# Patient Record
Sex: Male | Born: 1980 | Race: White | Hispanic: No | Marital: Married | Smoking: Never smoker
Health system: Southern US, Community
[De-identification: ages and names within clinical notes are randomized; demographics above are authoritative.]

## PROBLEM LIST (undated history)

## (undated) DIAGNOSIS — T7840XA Allergy, unspecified, initial encounter: Secondary | ICD-10-CM

## (undated) DIAGNOSIS — K602 Anal fissure, unspecified: Secondary | ICD-10-CM

## (undated) DIAGNOSIS — G5600 Carpal tunnel syndrome, unspecified upper limb: Secondary | ICD-10-CM

## (undated) DIAGNOSIS — L439 Lichen planus, unspecified: Secondary | ICD-10-CM

## (undated) DIAGNOSIS — N486 Induration penis plastica: Secondary | ICD-10-CM

## (undated) DIAGNOSIS — K589 Irritable bowel syndrome without diarrhea: Secondary | ICD-10-CM

## (undated) HISTORY — DX: Carpal tunnel syndrome, unspecified upper limb: G56.00

## (undated) HISTORY — DX: Lichen planus, unspecified: L43.9

## (undated) HISTORY — DX: Anal fissure, unspecified: K60.2

## (undated) HISTORY — DX: Induration penis plastica: N48.6

## (undated) HISTORY — DX: Irritable bowel syndrome, unspecified: K58.9

## (undated) HISTORY — DX: Allergy, unspecified, initial encounter: T78.40XA

## (undated) HISTORY — PX: CIRCUMCISION: SUR203

## (undated) HISTORY — PX: CARPAL TUNNEL RELEASE: SHX101

---

## 2013-01-01 DIAGNOSIS — G5600 Carpal tunnel syndrome, unspecified upper limb: Secondary | ICD-10-CM | POA: Insufficient documentation

## 2014-06-15 ENCOUNTER — Inpatient Hospital Stay: Payer: Self-pay | Admitting: Internal Medicine

## 2014-06-15 LAB — COMPREHENSIVE METABOLIC PANEL
ALBUMIN: 4.1 g/dL (ref 3.4–5.0)
ALT: 84 U/L — AB
Alkaline Phosphatase: 110 U/L
Anion Gap: 8 (ref 7–16)
BUN: 20 mg/dL — ABNORMAL HIGH (ref 7–18)
Bilirubin,Total: 1 mg/dL (ref 0.2–1.0)
CHLORIDE: 104 mmol/L (ref 98–107)
CREATININE: 1.14 mg/dL (ref 0.60–1.30)
Calcium, Total: 8.8 mg/dL (ref 8.5–10.1)
Co2: 24 mmol/L (ref 21–32)
EGFR (African American): >60
Glucose: 108 mg/dL — ABNORMAL HIGH (ref 65–99)
OSMOLALITY: 275 (ref 275–301)
Potassium: 3.8 mmol/L (ref 3.5–5.1)
SGOT(AST): 57 U/L — ABNORMAL HIGH (ref 15–37)
SODIUM: 136 mmol/L (ref 136–145)
Total Protein: 7.4 g/dL (ref 6.4–8.2)

## 2014-06-15 LAB — CBC
HCT: 46.3 % (ref 40.0–52.0)
HGB: 15.7 g/dL (ref 13.0–18.0)
MCH: 28.1 pg (ref 26.0–34.0)
MCHC: 33.9 g/dL (ref 32.0–36.0)
MCV: 83 fL (ref 80–100)
Platelet: 186 10*3/uL (ref 150–440)
RBC: 5.57 10*6/uL (ref 4.40–5.90)
RDW: 13.3 % (ref 11.5–14.5)
WBC: 10.5 10*3/uL (ref 3.8–10.6)

## 2014-06-16 LAB — CBC WITH DIFFERENTIAL/PLATELET
BASOS ABS: 0.1 10*3/uL (ref 0.0–0.1)
Basophil %: 0.9 %
Eosinophil #: 0.2 10*3/uL (ref 0.0–0.7)
Eosinophil %: 1.3 %
HCT: 42.3 % (ref 40.0–52.0)
HGB: 14 g/dL (ref 13.0–18.0)
LYMPHS PCT: 0.9 %
Lymphocyte #: 0.1 10*3/uL — ABNORMAL LOW (ref 1.0–3.6)
MCH: 28.2 pg (ref 26.0–34.0)
MCHC: 33.2 g/dL (ref 32.0–36.0)
MCV: 85 fL (ref 80–100)
MONO ABS: 0.8 x10 3/mm (ref 0.2–1.0)
Monocyte %: 4.9 %
Neutrophil #: 15.2 10*3/uL — ABNORMAL HIGH (ref 1.4–6.5)
Neutrophil %: 92 %
PLATELETS: 172 10*3/uL (ref 150–440)
RBC: 4.98 10*6/uL (ref 4.40–5.90)
RDW: 13.5 % (ref 11.5–14.5)
WBC: 16.5 10*3/uL — ABNORMAL HIGH (ref 3.8–10.6)

## 2014-06-20 LAB — CULTURE, BLOOD (SINGLE)

## 2014-07-04 DIAGNOSIS — K4090 Unilateral inguinal hernia, without obstruction or gangrene, not specified as recurrent: Secondary | ICD-10-CM | POA: Insufficient documentation

## 2014-07-04 DIAGNOSIS — N50819 Testicular pain, unspecified: Secondary | ICD-10-CM | POA: Insufficient documentation

## 2014-07-04 DIAGNOSIS — N4340 Spermatocele of epididymis, unspecified: Secondary | ICD-10-CM | POA: Insufficient documentation

## 2014-11-18 NOTE — H&P (Signed)
PATIENT NAME:  Charles Gonzales, Charles Gonzales MR#:  [id number] DATE OF BIRTH:  Jul 26, 1981  DATE OF ADMISSION:  06/15/2014  PRIMARY CARE PHYSICIAN: None.  CHIEF COMPLAINT: Fever and pain in the buttocks region.   HISTORY OF PRESENT ILLNESS: This is a very pleasant 34 year old male with no past medical history who presents to the ER with weakness and pain in his buttocks. In the ER, it was that he might have a cellulitis and was going to be discharged on p.o. Bactrim, but apparently he had tachycardia, heart rates in the 150s, and a fever, so he was admitted for sepsis.   REVIEW OF SYSTEMS: CONSTITUTIONAL: Positive fever, fatigue, weakness. EYES: No blurred or double vision, glaucoma or cataracts. ENT: No ear pain, hearing loss, snoring, postnasal drip. RESPIRATORY: No cough, wheezing, hemoptysis, COPD. CARDIOVASCULAR: No chest pain, orthopnea, edema, arrhythmia, syncope. GASTROINTESTINAL: No nausea, vomiting, diarrhea, abdominal pain, melena, or ulcers. GENITOURINARY: No dysuria or hematuria.  ENDOCRINE: No polyuria or polydipsia.  HEME AND LYMPH: No anemia or easy bruising.  SKIN: No rash or lesions.  MUSCULOSKELETAL: No limited activity. NEUROLOGIC: No history of CVA, TIA or seizure.  PSYCHIATRIC: No history of anxiety or depression.   PAST MEDICAL HISTORY: None.   ALLERGIES: No known drug allergies.   MEDICATIONS: None.   SOCIAL HISTORY: No tobacco, alcohol or drug use.   PAST SURGICAL HISTORY: None.   PHYSICAL EXAMINATION: VITAL SIGNS: Temperature 98.5, pulse 126, respirations 16, blood pressure 107/62, 92% on room air. It is noted in the ER the patient's temperature was 103.  GENERAL: The patient is alert and oriented, not in acute distress.  HEENT: Head is atraumatic. Pupils are round and reactive. Sclerae anicteric. Mucous membranes are moist. Oropharynx is clear.   NECK: Supple without JVD, carotid bruit, or enlarged thyroid.  CARDIOVASCULAR: Tachycardia. No murmurs, gallops, or rubs. PMI  is not displaced.  LUNGS: Clear to auscultation without crackles, rales, rhonchi or wheezing. Normal to percussion. No dullness to percussion.  BACK: No costovertebral angle or vertebral tenderness.  ABDOMEN: Obese, soft. Bowel sounds positive. Nontender, nondistended. No hepatosplenomegaly.  EXTREMITIES: No clubbing, cyanosis or edema.  SKIN: In his gluteal crease, he has an indurated area, but no obvious abscess is noted.   DIAGNOSTIC DATA: Ultrasound shows no abscess. This is of his left gluteal crease.   Sodium 136, potassium 3.8, chloride 104, bicarb 24, BUN 20, creatinine 1.14, glucose 108, alk phos 110, ALT 84, AST 57, total protein 7.4, albumin 4.1. White blood cells 10.5, hemoglobin 15.7, hematocrit 47, platelets 186,000.  ASSESSMENT AND PLAN: A 34 year old male who presented with cellulitis of his gluteal crease and subsequently developed sepsis as indicated by fever and tachycardia. 1.  Sepsis as indicated by fever and tachycardia, secondary to cellulitis. The patient's blood cultures have been ordered. We will continue clindamycin, IV fluids and monitor heart rate and temperature as well along with other vitals.  2.  Cellulitis. left gluteal crease The patient is on clindamycin, which we will continue. Follow up on blood cultures.   CODE STATUS: The patient is FULL code status.  TIME SPENT: Approximately 45 minutes.   ____________________________ Donell Beers. Benjie Karvonen, MD spm:sb D: 06/15/2014 13:00:39 ET T: 06/15/2014 13:28:43 ET JOB#: [id number]  cc: Caydence Enck P. Benjie Karvonen, MD, <Dictator> Dakwon Wenberg P Cythia Bachtel MD ELECTRONICALLY SIGNED 06/15/2014 14:00

## 2014-11-18 NOTE — Discharge Summary (Signed)
PATIENT NAME:  Charles Gonzales, Charles Gonzales MR#:  [id number] DATE OF BIRTH:  January 26, 1981  DATE OF ADMISSION:  06/15/2014 DATE OF DISCHARGE:  06/16/2014  ADMISSION DIAGNOSES:  1.  Sepsis.  2.  Cellulitis.   DISCHARGE DIAGNOSES:  1.  Sepsis.  2.  Cellulitis.   CONSULTATIONS: None.   PERTINENT LABORATORY STUDIES AT DISCHARGE: White blood cells 16, hemoglobin 14, hematocrit 42.3, platelets are 172,000. Blood culture is negative to date.   PHYSICAL EXAMINATION:  VITAL SIGNS: Temperature 99, pulse 98, respirations 18, blood pressure 118/74, 99% on room air.  GENERAL: The patient was alert and oriented, not in acute distress. CARDIOVASCULAR: Tachycardia. No murmurs, gallops, rubs. PMI was not displaced. LUNGS: Clear to auscultation without crackles, rales, rhonchi, or wheezing. Normal to percussion.  ABDOMEN: Bowel sounds are positive. Nontender, nondistended. No hepatosplenomegaly.  EXTREMITIES: No clubbing, cyanosis, or edema.Marland Kitchen.  SKIN: The patient's buttock cellulitis is much improved. He has a very minimal, small area of inflammation but no fluctuance, no abscess noted.   HOSPITAL COURSE: This is a 34 year old male who was admitted with skin cellulitis and sepsis. For further details, please refer to the H and P.  1.  Sepsis secondary to cellulitis of his left buttock crease. The patient had an indurated area. He was tachycardic. He had a fever, so was admitted for sepsis due to cellulitis. His vitals have improved. He has been afebrile, tolerating the antibiotics.  2.  Cellulitis, left buttock, which has markedly improved with clindamycin. The patient prefers to take Bactrim at discharge rather than p.o. clindamycin.   DISCHARGE MEDICATIONS:  1.  Bactrim 1 tablet p.o. b.i.d. x 8 days.  2.  Aleve 220 mg q.8 hours p.r.n.   DISCHARGE DIET: Regular diet.   DISCHARGE ACTIVITY: As tolerated.   DISCHARGE FOLLOWUP: The patient will need to follow up his primary care physician in 2 weeks.   The patient was  stable for discharge.   TIME SPENT: 35 minutes.    ____________________________ Janyth ContesSital P. Juliene PinaMody, MD spm:ah D: 06/16/2014 13:06:21 ET T: 06/16/2014 15:18:34 ET JOB#: [id number]  cc: Tarris Delbene P. Juliene PinaMody, MD, <Dictator> Janyth ContesSITAL P Giavonna Pflum MD ELECTRONICALLY SIGNED 06/17/2014 21:15

## 2015-07-05 DIAGNOSIS — N486 Induration penis plastica: Secondary | ICD-10-CM | POA: Insufficient documentation

## 2016-05-21 ENCOUNTER — Emergency Department: Payer: Worker's Compensation

## 2016-05-21 ENCOUNTER — Emergency Department
Admission: EM | Admit: 2016-05-21 | Discharge: 2016-05-21 | Disposition: A | Discharge status: Home / Self Care | Payer: Worker's Compensation | Attending: Emergency Medicine | Admitting: Emergency Medicine

## 2016-05-21 ENCOUNTER — Encounter: Payer: Self-pay | Admitting: Emergency Medicine

## 2016-05-21 DIAGNOSIS — M79642 Pain in left hand: Secondary | ICD-10-CM | POA: Diagnosis not present

## 2016-05-21 DIAGNOSIS — Y999 Unspecified external cause status: Secondary | ICD-10-CM | POA: Diagnosis not present

## 2016-05-21 DIAGNOSIS — Y9389 Activity, other specified: Secondary | ICD-10-CM | POA: Insufficient documentation

## 2016-05-21 DIAGNOSIS — Y92410 Unspecified street and highway as the place of occurrence of the external cause: Secondary | ICD-10-CM | POA: Diagnosis not present

## 2016-05-21 DIAGNOSIS — S62501A Fracture of unspecified phalanx of right thumb, initial encounter for closed fracture: Secondary | ICD-10-CM

## 2016-05-21 DIAGNOSIS — R0789 Other chest pain: Secondary | ICD-10-CM | POA: Insufficient documentation

## 2016-05-21 DIAGNOSIS — M7918 Myalgia, other site: Secondary | ICD-10-CM

## 2016-05-21 DIAGNOSIS — M542 Cervicalgia: Secondary | ICD-10-CM | POA: Insufficient documentation

## 2016-05-21 DIAGNOSIS — S60511A Abrasion of right hand, initial encounter: Secondary | ICD-10-CM | POA: Diagnosis not present

## 2016-05-21 DIAGNOSIS — S62201A Unspecified fracture of first metacarpal bone, right hand, initial encounter for closed fracture: Secondary | ICD-10-CM | POA: Diagnosis not present

## 2016-05-21 DIAGNOSIS — S6991XA Unspecified injury of right wrist, hand and finger(s), initial encounter: Secondary | ICD-10-CM | POA: Diagnosis present

## 2016-05-21 MED ORDER — BACLOFEN 10 MG PO TABS: 10.0000 mg | ORAL_TABLET | Freq: Three times a day (TID) | ORAL | 0 refills | Status: DC
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
## 2016-12-23 ENCOUNTER — Ambulatory Visit: Payer: Self-pay | Admitting: Family Medicine

## 2017-01-02 ENCOUNTER — Ambulatory Visit (INDEPENDENT_AMBULATORY_CARE_PROVIDER_SITE_OTHER): Payer: Self-pay | Admitting: Family Medicine

## 2017-01-02 ENCOUNTER — Encounter: Payer: Self-pay | Admitting: Family Medicine

## 2017-01-02 VITALS — BP 124/80 | HR 109 | Temp 98.3°F | Ht 69.0 in | Wt 232.5 lb

## 2017-01-02 DIAGNOSIS — K589 Irritable bowel syndrome without diarrhea: Secondary | ICD-10-CM

## 2017-01-02 DIAGNOSIS — L439 Lichen planus, unspecified: Secondary | ICD-10-CM

## 2017-01-02 DIAGNOSIS — Z0000 Encounter for general adult medical examination without abnormal findings: Secondary | ICD-10-CM

## 2017-01-02 DIAGNOSIS — Z8249 Family history of ischemic heart disease and other diseases of the circulatory system: Secondary | ICD-10-CM | POA: Insufficient documentation

## 2017-01-02 DIAGNOSIS — Z7189 Other specified counseling: Secondary | ICD-10-CM

## 2017-01-02 DIAGNOSIS — K602 Anal fissure, unspecified: Secondary | ICD-10-CM

## 2017-01-02 NOTE — Patient Instructions (Signed)
Use the diltiazem cream and update me as needed.  I'll await your labs.  Take care.  Glad to see you.

## 2017-01-03 ENCOUNTER — Encounter: Payer: Self-pay | Admitting: Family Medicine

## 2017-01-03 DIAGNOSIS — Z0000 Encounter for general adult medical examination without abnormal findings: Secondary | ICD-10-CM | POA: Insufficient documentation

## 2017-01-03 DIAGNOSIS — K589 Irritable bowel syndrome without diarrhea: Secondary | ICD-10-CM | POA: Insufficient documentation

## 2017-01-03 DIAGNOSIS — Z7189 Other specified counseling: Secondary | ICD-10-CM | POA: Insufficient documentation

## 2017-01-03 DIAGNOSIS — K602 Anal fissure, unspecified: Secondary | ICD-10-CM | POA: Insufficient documentation

## 2017-01-03 DIAGNOSIS — L439 Lichen planus, unspecified: Secondary | ICD-10-CM | POA: Insufficient documentation

## 2017-01-03 NOTE — Assessment & Plan Note (Signed)
Advanced directive discussed with patient.  Wife designated if patient were incapacitated. ?

## 2017-01-03 NOTE — Progress Notes (Signed)
New patient. No problem. Pain with bowel movements. He feels like there is a problem near the rectum on the left side. Pain with all movements but usually not otherwise. Has a history of IBS diagnosed at age 36. He has occasional constipation and occasional flares of diarrhea that are diet related. Otherwise not passing blood in the stool. No abnormal weight loss. No nausea. No vomiting.  Other issues. Family history coronary disease. Due for routine labs. I gave him an order that was hand written for a lipid panel and glucose to be done at outside lab facility.  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 Previous biopsy done by ENT. He treats topically as needed. No other cutaneous lichen planus lesions.  Other issues. DTaP 2017. Flu shot done at work each year. Pneumonia and shingles not due. Not due for colon or prostate cancer screening. Labs pending as above. Advanced directive discussed with patient. Wife designated if patient were incapacitated. HIV screening declined, he is low risk. Diet and exercise discussed with patient.  PMH and SH reviewed  ROS: Per HPI unless specifically indicated in ROS section   Meds, vitals, and allergies reviewed.   GEN: nad, alert and oriented HEENT: mucous membranes moist, small irritated lesion noted on the left side of the tongue, this is the typical appearance he has per his report when he has slight complaint is present. NECK: supple w/o LA CV: rrr.  PULM: ctab, no inc wob ABD: soft, +bs EXT: no edema SKIN: no acute rash External rectal exam with no extra hemorrhoids. No gross blood. Nonbleeding fissure at 7:00 on the rectum.

## 2017-01-03 NOTE — Assessment & Plan Note (Signed)
DTaP 2017.  Flu shot done at work each year.  Pneumonia and shingles not due.  Not due for colon or prostate cancer screening.  Advanced directive discussed with patient. Wife designated if patient were incapacitated.  HIV screening declined, he is low risk.  Diet and exercise discussed with patient.

## 2017-01-03 NOTE — Assessment & Plan Note (Signed)
Prescription done for diltiazem 2% cream. Hand written and given to patient. Apply to affected area twice a day as needed. Dispensed 30 g, 1 refill. Use as needed and update me in the meantime as needed. He agrees. Anatomy discussed with patient.

## 2017-01-03 NOTE — Assessment & Plan Note (Signed)
He treats topically as needed. Limited to the left side of the tongue.

## 2018-01-29 ENCOUNTER — Other Ambulatory Visit: Payer: Self-pay | Admitting: Family Medicine

## 2018-01-30 LAB — LIPID PANEL W/O CHOL/HDL RATIO
Cholesterol, Total: 202 mg/dL — ABNORMAL HIGH (ref 100–199)
HDL: 41 mg/dL (ref >39)
LDL CALC: 135 mg/dL — AB (ref 0–99)
Triglycerides: 131 mg/dL (ref 0–149)
VLDL Cholesterol Cal: 26 mg/dL (ref 5–40)

## 2018-01-30 LAB — GLUCOSE, RANDOM: GLUCOSE: 96 mg/dL (ref 65–99)

## 2018-02-03 ENCOUNTER — Encounter: Payer: Self-pay | Admitting: Family Medicine

## 2018-02-04 ENCOUNTER — Encounter: Payer: Self-pay | Admitting: *Deleted

## 2018-03-25 ENCOUNTER — Encounter: Payer: Self-pay | Admitting: Family Medicine

## 2018-03-25 ENCOUNTER — Ambulatory Visit: Payer: Self-pay | Admitting: Family Medicine

## 2018-03-25 VITALS — BP 118/82 | HR 74 | Temp 98.2°F | Ht 69.0 in | Wt 234.5 lb

## 2018-03-25 DIAGNOSIS — J0120 Acute ethmoidal sinusitis, unspecified: Secondary | ICD-10-CM

## 2018-03-25 DIAGNOSIS — J019 Acute sinusitis, unspecified: Secondary | ICD-10-CM | POA: Insufficient documentation

## 2018-03-25 MED ORDER — PREDNISONE 10 MG PO TABS: ORAL_TABLET | ORAL | 0 refills | Status: DC

## 2018-03-25 MED ORDER — CEFDINIR 300 MG PO CAPS: 300.0000 mg | ORAL_CAPSULE | Freq: Two times a day (BID) | ORAL | 0 refills | Status: DC

## 2018-03-25 NOTE — Progress Notes (Signed)
Subjective:    Patient ID: Charles Gonzales, male    DOB: 01/15/1981, 37 y.o.   MRN: [id number]  HPI 37 yo pt of Dr Para Marchuncan here with cough and nasal congestion for 1 1/2 months   Now getting tired of it  Nasal d/c changed to green/thick  He had e visit and was tx with omnicef for 7 d  Helped some - but not all the way better   Still has a lot of congestion and pnd  Coughing  Not sleeping mucinex D Humidifier  Sinus pressure/pain - cheeks/forehead  D/c has turned white  Never had a fever   At its worst- he used and inhaler for wheezing -that is better    May have some env allergies- taking allegra  Not sneezing a lot    Patient Active Problem List   Diagnosis Date Noted  . Acute sinusitis 03/25/2018  . Health care maintenance 01/03/2017  . Advance care planning 01/03/2017  . Rectal fissure 01/03/2017  . IBS (irritable bowel syndrome)   . Lichen planus   . FH: CAD (coronary artery disease) 01/02/2017   Past Medical History:  Diagnosis Date  . Carpal tunnel syndrome   . IBS (irritable bowel syndrome)   . Lichen planus   . Peyronie disease   . Rectal fissure    Past Surgical History:  Procedure Laterality Date  . CARPAL TUNNEL RELEASE     Social History   Tobacco Use  . Smoking status: Never Smoker  . Smokeless tobacco: Never Used  Substance Use Topics  . Alcohol use: No  . Drug use: No   Family History  Problem Relation Age of Onset  . Hyperlipidemia Father   . Hypertension Father   . Heart disease Paternal Grandfather   . Colon cancer Neg Hx   . Prostate cancer Neg Hx    Allergies  Allergen Reactions  . Clindamycin/Lincomycin Shortness Of Breath  . Amoxicillin Rash   Current Outpatient Medications on File Prior to Visit  Medication Sig Dispense Refill  . fexofenadine (ALLEGRA) 180 MG tablet Take 180 mg by mouth daily as needed for allergies or rhinitis.     No current facility-administered medications on file prior to visit.     Review  of Systems  Constitutional: Negative for appetite change, fatigue and fever.  HENT: Positive for congestion, ear pain, postnasal drip, rhinorrhea, sinus pressure and sore throat. Negative for nosebleeds.   Eyes: Negative for pain, redness and itching.  Respiratory: Positive for cough. Negative for shortness of breath and wheezing.   Cardiovascular: Negative for chest pain.  Gastrointestinal: Negative for abdominal pain, diarrhea, nausea and vomiting.  Endocrine: Negative for polyuria.  Genitourinary: Negative for dysuria, frequency and urgency.  Musculoskeletal: Negative for arthralgias and myalgias.  Allergic/Immunologic: Negative for immunocompromised state.  Neurological: Positive for headaches. Negative for dizziness, tremors, syncope, weakness and numbness.  Hematological: Negative for adenopathy. Does not bruise/bleed easily.  Psychiatric/Behavioral: Negative for dysphoric mood. The patient is not nervous/anxious.        Objective:   Physical Exam  Constitutional: He appears well-developed and well-nourished. No distress.  HENT:  Head: Normocephalic and atraumatic.  Right Ear: External ear normal.  Left Ear: External ear normal.  Mouth/Throat: Oropharynx is clear and moist. No oropharyngeal exudate.  Nares are injected and congested  Bilateral ethmoid (less so frontal) sinus tenderness  Post nasal drip -clear  Mild post throat injection -no lesions   Eyes: Pupils are equal, round, and reactive  to light. Conjunctivae and EOM are normal. Right eye exhibits no discharge. Left eye exhibits no discharge. No scleral icterus.  Neck: Normal range of motion. Neck supple.  Cardiovascular: Normal rate and regular rhythm.  Pulmonary/Chest: Effort normal and breath sounds normal. No respiratory distress. He has no wheezes. He has no rales. He exhibits no tenderness.  Good air exch No wheeze even on forced exp  Musculoskeletal: Normal range of motion.  Lymphadenopathy:    He has no  cervical adenopathy.  Neurological: He is alert. No cranial nerve deficit.  Skin: Skin is warm and dry. No rash noted. No erythema.  Psychiatric: He has a normal mood and affect.  Pleasant           Assessment & Plan:   Problem List Items Addressed This Visit      Respiratory   Acute sinusitis - Primary    Improved but not resolved with 7 d of cefdinir Will extend another 7 d Fluids/ guaifenesin/rest/saline Trial of nasal steroid If no further imp - prednisone 30 mg taper given (printed to hold)  Update if not starting to improve in a week or if worsening        Relevant Medications   fexofenadine (ALLEGRA) 180 MG tablet   cefdinir (OMNICEF) 300 MG capsule   predniSONE (DELTASONE) 10 MG tablet

## 2018-03-25 NOTE — Assessment & Plan Note (Signed)
Improved but not resolved with 7 d of cefdinir Will extend another 7 d Fluids/ guaifenesin/rest/saline Trial of nasal steroid If no further imp - prednisone 30 mg taper given (printed to hold)  Update if not starting to improve in a week or if worsening

## 2018-03-25 NOTE — Patient Instructions (Signed)
Take the cefdinir for another 7 days  Fluids/nasal saline   Try flonase sensimyst or nasacort over the counter   If no further improvement- fill the prednisone   mucinex D is fine  Update if not starting to improve in a week or if worsening

## 2018-03-26 ENCOUNTER — Ambulatory Visit: Payer: Self-pay | Admitting: Family Medicine

## 2018-08-05 ENCOUNTER — Encounter: Payer: Self-pay | Admitting: Family Medicine

## 2018-08-06 ENCOUNTER — Encounter: Payer: Self-pay | Admitting: Family Medicine

## 2018-08-06 ENCOUNTER — Ambulatory Visit (INDEPENDENT_AMBULATORY_CARE_PROVIDER_SITE_OTHER): Payer: Self-pay | Admitting: Family Medicine

## 2018-08-06 ENCOUNTER — Telehealth: Payer: Self-pay | Admitting: Family Medicine

## 2018-08-06 DIAGNOSIS — L989 Disorder of the skin and subcutaneous tissue, unspecified: Secondary | ICD-10-CM

## 2018-08-06 MED ORDER — SULFAMETHOXAZOLE-TRIMETHOPRIM 800-160 MG PO TABS: 2.0000 | ORAL_TABLET | Freq: Two times a day (BID) | ORAL | 0 refills | Status: DC

## 2018-08-06 NOTE — Telephone Encounter (Signed)
I left a message at the pts cell number to call back for an appt as he can be seen today at 4pm per below.

## 2018-08-06 NOTE — Progress Notes (Signed)
Sx noted yesterday.  Felt something in the R upper inner thigh.  Checked it last night and felt a lump, ttp. No drainage.  Use a salve.  No temps known >100.  No sweats.    Resolving URI sx, some better in the meantime.  He had some nasal irritation with blowing nose frequently.    Meds, vitals, and allergies reviewed.   ROS: Per HPI unless specifically indicated in ROS section   nad ncat Skin with small ~1cm area on the right upper inner thigh.  He has some peripheral irritation that looks to be related to previous Band-Aid application, that being a separate issue.  The central area is likely too small to I&D.  He has some skin thickening locally but this is still mild.  No ulceration.  It has not yet come to ahead.  This looks like a small and isolated area that is affected, not a widespread issue.

## 2018-08-06 NOTE — Telephone Encounter (Signed)
Thanks

## 2018-08-06 NOTE — Telephone Encounter (Signed)
I was not able to reach pt at any contact # but I did speak with pts wife (DPR signed) and she will contact pt and call our office back to let us know if pt can come todaya t 4PM.

## 2018-08-06 NOTE — Telephone Encounter (Signed)
Pt can be here today at 4:15 and Dr Para March said that was OK. Pt scheduled 08/06/18 at 4:15. FYI to Dr Para March.

## 2018-08-06 NOTE — Telephone Encounter (Signed)
Please check with patient.  See if he can come in today at 4pm.  Okay to open a slot.  Thanks.

## 2018-08-06 NOTE — Patient Instructions (Signed)
Start septra, two tabs twice a day.   Warm compresses and update me as needed.  Take care.  Glad to see you.

## 2018-08-08 DIAGNOSIS — R21 Rash and other nonspecific skin eruption: Secondary | ICD-10-CM | POA: Insufficient documentation

## 2018-08-08 DIAGNOSIS — L989 Disorder of the skin and subcutaneous tissue, unspecified: Secondary | ICD-10-CM | POA: Insufficient documentation

## 2018-08-08 NOTE — Assessment & Plan Note (Signed)
This looks like a small and isolated area that is affected, not a widespread issue.  He still okay for outpatient follow-up.  We talked about options.  The area is likely too small for incision and drainage.  He agreed.  Likely reasonable to start antibiotics in the meantime.  I cannot prove that it is MRSA without culture data but it is reasonable to presume and treat as such with Septra, 2 tabs twice a day.  He understood routine cautions and he will update me as needed.

## 2018-08-09 ENCOUNTER — Ambulatory Visit: Payer: Self-pay | Admitting: Family Medicine

## 2018-09-02 ENCOUNTER — Ambulatory Visit: Payer: Self-pay | Admitting: Physician Assistant

## 2018-09-02 ENCOUNTER — Encounter: Payer: Self-pay | Admitting: Physician Assistant

## 2018-09-02 VITALS — BP 132/100 | HR 86 | Temp 98.3°F | Resp 16 | Ht 68.0 in | Wt 236.0 lb

## 2018-09-02 DIAGNOSIS — S29019A Strain of muscle and tendon of unspecified wall of thorax, initial encounter: Secondary | ICD-10-CM

## 2018-09-02 DIAGNOSIS — M62830 Muscle spasm of back: Secondary | ICD-10-CM

## 2018-09-02 MED ORDER — PREDNISONE 50 MG PO TABS: 50.0000 mg | ORAL_TABLET | Freq: Every day | ORAL | 0 refills | Status: AC

## 2018-09-02 MED ORDER — METAXALONE 800 MG PO TABS: 800.0000 mg | ORAL_TABLET | Freq: Three times a day (TID) | ORAL | 0 refills | Status: DC

## 2018-09-02 NOTE — Patient Instructions (Signed)
Thank you for choosing [facility] for your health care needs.  You have been diagnosed with a thoracic muscle strain and muscle spasm of the back.  Take medication as prescribed: Meds ordered this encounter  Medications  . metaxalone (SKELAXIN) 800 MG tablet    Sig: Take 1 tablet (800 mg total) by mouth 3 (three) times daily.    Dispense:  20 tablet    Refill:  0    Order Specific Question:   Supervising Provider    Answer:   MILLER, BRIAN [[id number]]  . predniSONE (DELTASONE) 50 MG tablet    Sig: Take 1 tablet (50 mg total) by mouth daily with breakfast for 5 days.    Dispense:  5 tablet    Refill:  0    Order Specific Question:   Supervising Provider    Answer:   Eber Hong [[id number]]   Muscle relaxer may make you tired. Prednisone should be taken with food. May cause stomach upset. Use over the counter Pepcid for stomach relief.  Apply ice and/or heat to area of discomfort, alternating, 15-20 minutes at a time. Use over the counter Bengay, IcyHot, or BioFreeze. Perform gentle stretching exercises.  Follow-up with orthopedist in one week if symptoms not improving. Follow-up sooner if you develop leg weakness, tingling/numbness, difficulty walking, worsening pain, or other new/concerning symptom.  Hope you feel better soon!  Thoracic Strain  Thoracic strain is an injury to the muscles or tendons that attach to the upper back. A strain can be mild or severe. A mild strain may take only 1-2 weeks to heal. A severe strain involves torn muscles or tendons, so it may take 6-8 weeks to heal. Follow these instructions at home:  Rest as needed. Limit your activity as told by your doctor.  If directed, put ice on the injured area: ? Put ice in a plastic bag. ? Place a towel between your skin and the bag. ? Leave the ice on for 20 minutes, 2-3 times per day.  Take over-the-counter and prescription medicines only as told by your doctor.  Begin doing exercises as told by your doctor or  physical therapist.  Warm up before being active.  Bend your knees before you lift heavy objects.  Keep all follow-up visits as told by your doctor. This is important. Contact a doctor if:  Your pain is not helped by medicine.  Your pain, bruising, or swelling is getting worse.  You have a fever. Get help right away if:  You have shortness of breath.  You have chest pain.  You have weakness or loss of feeling (numbness) in your legs.  You cannot control when you pee (urinate). This information is not intended to replace advice given to you by your health care provider. Make sure you discuss any questions you have with your health care provider. Document Released: 12/31/2007 Document Revised: 03/15/2016 Document Reviewed: 09/07/2014 Elsevier Interactive Patient Education  2019 [employer].

## 2018-09-02 NOTE — Progress Notes (Signed)
Patient ID: Charles Gonzales DOB: 1981-01-12 AGE: 38 y.o. MRN: [id number]   PCP: Joaquim Nam, MD   Chief Complaint:  Chief Complaint  Patient presents with  . Spasms    x1d     Subjective:    HPI:  Charles Gonzales is a 38 y.o. male presents for evaluation  Chief Complaint  Patient presents with  . Spasms    x39d   38 year old male presents to [facility] with two day history of mid left back pain. Occurred yesterday, while sitting in stool, next to exam bed, leaned over in reaching position, sudden pulling in mid left back. Patient works as a Secondary school teacher in wound care. Caught patient's breath. Persisted throughout the rest of the day. Constant soreness/discomfort with associated muscle tightness/spasm. Exacerbated with certain movements; primarily left rotation. Took OTC Aleve (two 220mg  tablets), once immediately when it happened and again yesterday evening. Minimal improvement. Patient was able to find comfortable position and fall asleep. Pain was worse this morning. Stiff. Took 30 minute hot shower. Felt a little better. Applied over the counter topical muscle agent (name unknown) with no relief. Denies fever, chills, neck pain, low back pain, saddle anesthesia, bowel/bladder incontinence, urinary retention, radiating pain, lower extremity pain/weakness/paresthesias, difficulty ambulating.  Patient with previous history of similar symptoms. Typically one episode every two years. Resolves with muscle relaxer, Skelaxin has worked best in the past. Patient used to work in Haematologist. Took x-ray of thoracic spine, no abnormality.  Patient with no previous history of nephrolithiasis/ureteral [place]. Denies hematuria, increased urinary frequency, urinary urgency, difficulty initiating a stream.  A limited review of symptoms was performed, pertinent positives and negatives as mentioned in HPI.  The following portions of the patient's history were reviewed and updated as  appropriate: allergies, current medications and past medical history.  Patient Active Problem List   Diagnosis Date Noted  . Skin lesion 08/08/2018  . Health care maintenance 01/03/2017  . Advance care planning 01/03/2017  . Rectal fissure 01/03/2017  . IBS (irritable bowel syndrome)   . Lichen planus   . FH: CAD (coronary artery disease) 01/02/2017    Allergies  Allergen Reactions  . Clindamycin/Lincomycin Shortness Of Breath  . Amoxicillin Rash    Current Outpatient Medications on File Prior to Visit  Medication Sig Dispense Refill  . fexofenadine (ALLEGRA) 180 MG tablet Take 180 mg by mouth daily as needed for allergies or rhinitis.    . naproxen (NAPROSYN) 500 MG tablet Frequency:   Dosage:0.0     Instructions:  Note:     No current facility-administered medications on file prior to visit.        Objective:   Vitals:   09/02/18 1001  BP: (!) 132/100  Pulse: 86  Resp: 16  Temp: 98.3 F (36.8 C)  SpO2: 96%     Wt Readings from Last 3 Encounters:  09/02/18 236 lb (107 kg)  08/06/18 238 lb 12.8 oz (108.3 kg)  03/25/18 234 lb 8 oz (106.4 kg)    Physical Exam:   General Appearance:  Patient sitting comfortably on examination table. No antalgia, moves freely. Conversational. Peri Jefferson self-historian. In no acute distress. Afebrile.   Head:  Normocephalic, without obvious abnormality, atraumatic  Neck: Supple, symmetrical, trachea midline, no adenopathy  Lungs:   Clear to auscultation bilaterally, respirations unlabored  Heart:  Regular rate and rhythm, S1 and S2 normal, no murmur, rub, or gallop  Extremities: Extremities normal, atraumatic, no cyanosis or edema  Pulses: 2+ and symmetric  Skin: Skin color, texture, turgor normal, no rashes or lesions  Lymph nodes: Cervical, supraclavicular, and axillary nodes normal  Neurologic: Back normal to inspection. No gross deformity. No scoliosis. No erythema or ecchymosis. No tenderness with palpation along midline cervical,  thoracic, or lumbar spine. No palpable stepoff, deformity, or crepitus. Mild tenderness with palpation over mid thoracic, left paraspinal musculature. Adjacent to thoracic spine. Minimal palpable muscle tightness. No tenderness with palpation over bilateral lumbar paraspinal musculature. No tenderness with palpation over bilateral SI joints. Pain with flexion at 45 degrees, pain with left lateral rotation at 45 degrees. No pain with extension. No pain with right rotation. No pain with lateral flexion. Negative straight leg raise bilaterally. 5/5 lower extremity muscle strength. No peripheral edema. No gait abnormality. No difficulty with patient getting on and off exam table.    Assessment & Plan:    Exam findings, diagnosis etiology and medication use and indications reviewed with patient. Follow-Up and discharge instructions provided. No emergent/urgent issues found on exam.  Patient education was provided.   Patient verbalized understanding of information provided and agrees with plan of care (POC), all questions answered. The patient is advised to call or return to clinic if condition does not see an improvement in symptoms, or to seek the care of the closest emergency department if condition worsens with the below plan.    1. Thoracic myofascial strain, initial encounter - predniSONE (DELTASONE) 50 MG tablet; Take 1 tablet (50 mg total) by mouth daily with breakfast for 5 days.  Dispense: 5 tablet; Refill: 0  2. Back muscle spasm - metaxalone (SKELAXIN) 800 MG tablet; Take 1 tablet (800 mg total) by mouth 3 (three) times daily.  Dispense: 20 tablet; Refill: 0  Patient with two day history of left mid thoracic pain. Sustained during reaching motion. Previous history of thoracic muscle strain/muscle spasm, states feels similar. Mild tenderness with palpation and certain movements. No red flags: saddle anesthesia, bowel/bladder incontinence, urinary retention, lower extremity  weakness/paresthesias. Prescribed Skelaxin, as patient requested. Prescribed 5-day 50mg  prednisone blast. Discussed ice/heat, stretching exercises, OTC topical analgesic rubs, and rest. Advised patient f/u with orthopedist in one week if not improving, sooner with worsening symptoms. Patient agrees with plan.   Janalyn HarderSamantha Armilda Vanderlinden, MHS, PA-C Rulon SeraSamantha F. Britain Saber, MHS, PA-C Advanced Practice Provider [facility]  [facility]  [address], [facility], 1st Floor [place], [place] [address] (p):  [phone] [email or web address]

## 2018-09-06 ENCOUNTER — Telehealth: Payer: Self-pay | Admitting: Emergency Medicine

## 2018-09-06 NOTE — Telephone Encounter (Signed)
Left message following up on visit with [facility] 

## 2019-02-17 ENCOUNTER — Encounter: Payer: Self-pay | Admitting: Physician Assistant

## 2019-09-03 ENCOUNTER — Emergency Department
Admission: EM | Admit: 2019-09-03 | Discharge: 2019-09-03 | Disposition: A | Discharge status: Home / Self Care | Payer: PRIVATE HEALTH INSURANCE | Attending: Emergency Medicine | Admitting: Emergency Medicine

## 2019-09-03 ENCOUNTER — Other Ambulatory Visit: Payer: Self-pay

## 2019-09-03 ENCOUNTER — Emergency Department: Payer: PRIVATE HEALTH INSURANCE

## 2019-09-03 ENCOUNTER — Encounter: Payer: Self-pay | Admitting: *Deleted

## 2019-09-03 DIAGNOSIS — W19XXXA Unspecified fall, initial encounter: Secondary | ICD-10-CM

## 2019-09-03 DIAGNOSIS — Y929 Unspecified place or not applicable: Secondary | ICD-10-CM | POA: Insufficient documentation

## 2019-09-03 DIAGNOSIS — Y939 Activity, unspecified: Secondary | ICD-10-CM | POA: Diagnosis not present

## 2019-09-03 DIAGNOSIS — W11XXXA Fall on and from ladder, initial encounter: Secondary | ICD-10-CM | POA: Insufficient documentation

## 2019-09-03 DIAGNOSIS — Y999 Unspecified external cause status: Secondary | ICD-10-CM | POA: Insufficient documentation

## 2019-09-03 DIAGNOSIS — S0990XA Unspecified injury of head, initial encounter: Secondary | ICD-10-CM

## 2019-09-03 DIAGNOSIS — S161XXA Strain of muscle, fascia and tendon at neck level, initial encounter: Secondary | ICD-10-CM | POA: Diagnosis not present

## 2019-09-03 MED ORDER — IBUPROFEN 800 MG PO TABS
800.0000 mg | ORAL_TABLET | Freq: Once | ORAL | Status: AC
  Administered 2019-09-03: 800 mg via ORAL
  Filled 2019-09-03: qty 1

## 2019-09-03 MED ORDER — IBUPROFEN 800 MG PO TABS: 800.0000 mg | ORAL_TABLET | Freq: Three times a day (TID) | ORAL | 0 refills | Status: DC | PRN

## 2019-09-03 MED ORDER — METAXALONE 800 MG PO TABS: 800.0000 mg | ORAL_TABLET | Freq: Three times a day (TID) | ORAL | 1 refills | Status: AC

## 2019-09-03 MED ORDER — METAXALONE 800 MG PO TABS
800.0000 mg | ORAL_TABLET | Freq: Once | ORAL | Status: AC
  Administered 2019-09-03: 800 mg via ORAL
  Filled 2019-09-03: qty 1

## 2019-09-03 NOTE — ED Provider Notes (Signed)
[facility] Emergency Department Provider Note       Time seen: ----------------------------------------- 11:56 AM on 09/03/2019 -----------------------------------------   I have reviewed the triage vital signs and the nursing notes.  HISTORY   Chief Complaint Fall    HPI Charles Gonzales is a 39 y.o. male with a history of IBS, Peyronie's disease, rectal fissure who presents to the ED for a fall.  Patient reports he fell approximately 10 feet off a ladder onto concrete hit the back of his head and then his hip.  He denies loss of consciousness but reports feeling dazed, he has left jaw pain, and neck pain.  He also has tightness in his chest.  Past Medical History:  Diagnosis Date  . Carpal tunnel syndrome   . IBS (irritable bowel syndrome)   . Lichen planus   . Peyronie disease   . Rectal fissure     Patient Active Problem List   Diagnosis Date Noted  . Skin lesion 08/08/2018  . Health care maintenance 01/03/2017  . Advance care planning 01/03/2017  . Rectal fissure 01/03/2017  . IBS (irritable bowel syndrome)   . Lichen planus   . FH: CAD (coronary artery disease) 01/02/2017    Past Surgical History:  Procedure Laterality Date  . CARPAL TUNNEL RELEASE      Allergies Clindamycin/lincomycin and Amoxicillin  Social History Social History   Tobacco Use  . Smoking status: Never Smoker  . Smokeless tobacco: Never Used  Substance Use Topics  . Alcohol use: No  . Drug use: No    Review of Systems Constitutional: Negative for fever. Cardiovascular: Positive for chest tightness Respiratory: Negative for shortness of breath. Gastrointestinal: Negative for abdominal pain, vomiting and diarrhea. Musculoskeletal: Negative for back pain. Skin: Negative for rash. Neurological: Positive for headache, neck pain, facial pain  All systems negative/normal/unremarkable except as stated in the  HPI  ____________________________________________   PHYSICAL EXAM:  VITAL SIGNS: ED Triage Vitals  Enc Vitals Group     BP 09/03/19 1152 (!) 171/106     Pulse Rate 09/03/19 1152 76     Resp 09/03/19 1152 20     Temp --      Temp src --      SpO2 09/03/19 1152 99 %     Weight 09/03/19 1154 235 lb (106.6 kg)     Height 09/03/19 1154 5\' 9"  (1.753 m)     Head Circumference --      Peak Flow --      Pain Score 09/03/19 1154 2     Pain Loc --      Pain Edu? --      Excl. in [place]? --    Constitutional: Alert and oriented.  Anxious, mild distress Eyes: Conjunctivae are normal. Normal extraocular movements. ENT      Head: Normocephalic, posterior scalp tenderness      Nose: No congestion/rhinnorhea.      Mouth/Throat: Mucous membranes are moist.      Neck: C-spine tenderness Cardiovascular: Normal rate, regular rhythm. No murmurs, rubs, or gallops. Respiratory: Normal respiratory effort without tachypnea nor retractions. Breath sounds are clear and equal bilaterally. No wheezes/rales/rhonchi. Gastrointestinal: Soft and nontender. Normal bowel sounds Musculoskeletal: Nontender with normal range of motion in extremities. No lower extremity tenderness nor edema. Neurologic:  Normal speech and language. No gross focal neurologic deficits are appreciated.  Skin:  Skin is warm, dry and intact. No rash noted. Psychiatric: Mood and affect are normal. Speech and  behavior are normal.  ____________________________________________  ED COURSE:  As part of my medical decision making, I reviewed the following data within the electronic MEDICAL RECORD NUMBER History obtained from family if available, nursing notes, old chart and ekg, as well as notes from prior ED visits. Patient presented for a fall of approximately 10 feet, we will assess with imaging as indicated at this time.   Procedures  Charles Gonzales was evaluated in Emergency Department on 09/03/2019 for the symptoms described in the  history of present illness. He was evaluated in the context of the global COVID-19 pandemic, which necessitated consideration that the patient might be at risk for infection with the SARS-CoV-2 virus that causes COVID-19. Institutional protocols and algorithms that pertain to the evaluation of patients at risk for COVID-19 are in a state of rapid change based on information released by regulatory bodies including the CDC and federal and state organizations. These policies and algorithms were followed during the patient's care in the ED.  ____________________________________________   RADIOLOGY Images were viewed by me  CT head, C-spine, maxillofacial, chest x-ray Does not reveal any acute traumatic process ____________________________________________   DIFFERENTIAL DIAGNOSIS   Fall, contusion, fracture, musculoskeletal pain  FINAL ASSESSMENT AND PLAN  Fall, minor head injury, cervical strain   Plan: The patient had presented for a fall from approximately 10 feet.  Patient did not require any pain medicine during his visit.  Patient's imaging did not reveal any acute process.  He will be given anti-inflammatory muscle relaxants, encouraged to use ice and stretching exercises.  He is cleared for outpatient follow-up.   Ulice Dash, MD    Note: This note was generated in part or whole with voice recognition software. Voice recognition is usually quite accurate but there are transcription errors that can and very often do occur. I apologize for any typographical errors that were not detected and corrected.     Emily Filbert, MD 09/03/19 1158

## 2019-09-03 NOTE — ED Triage Notes (Signed)
Per patient's report, patient fell approximately 10 ft off of a ladder onto concrete and hit the back of his head first and then left hip. Patient denies LOC, but reports feeling dazed. Patient c/o headache and tightness in mid-back to chest.

## 2021-09-21 IMAGING — CR DG CHEST 2V
2 series · 2 of 2 positions shown · non-contrast
Comparison: None.

CLINICAL DATA: Fell off a ladder.

EXAM:
CHEST - 2 VIEW

[chest pa]
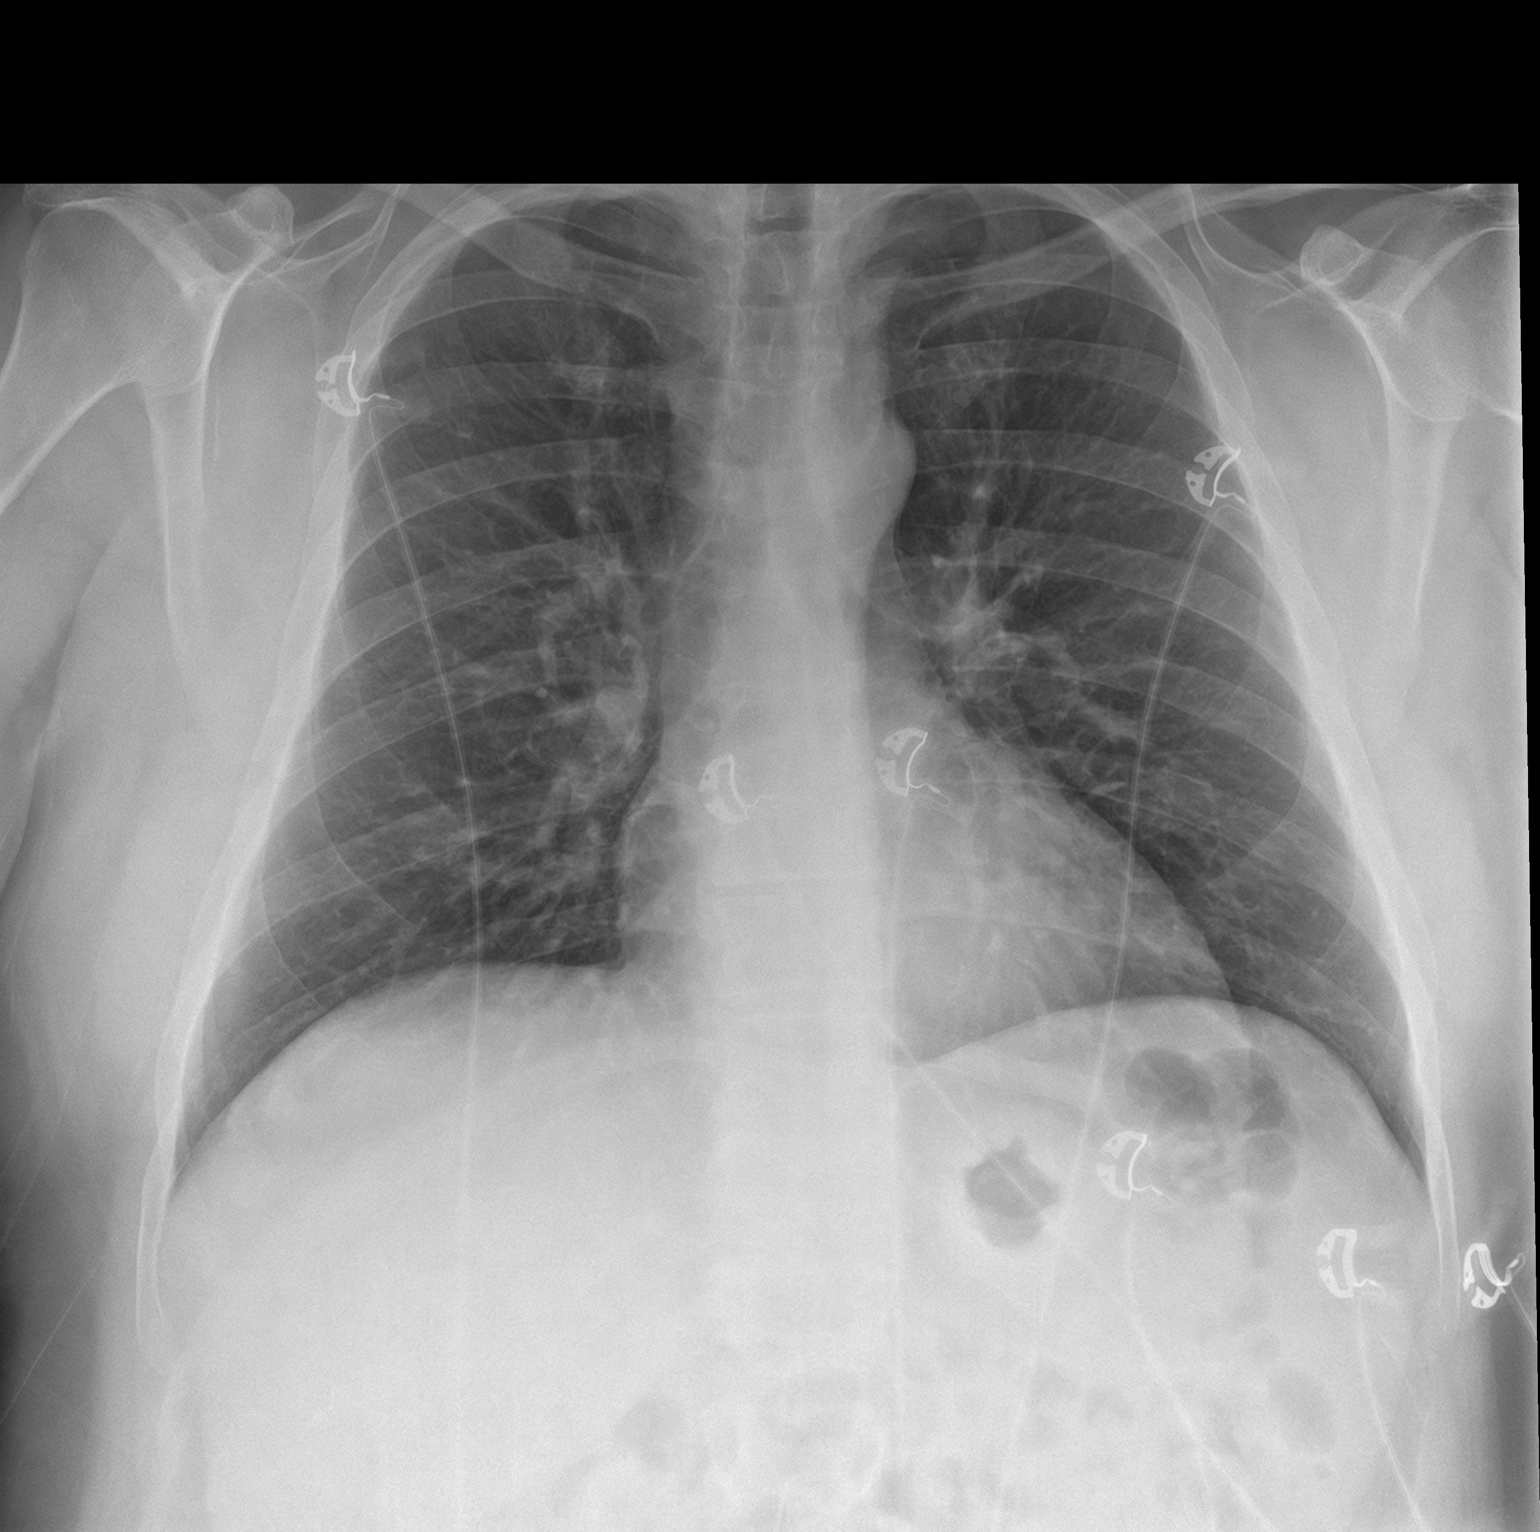

[chest lat]
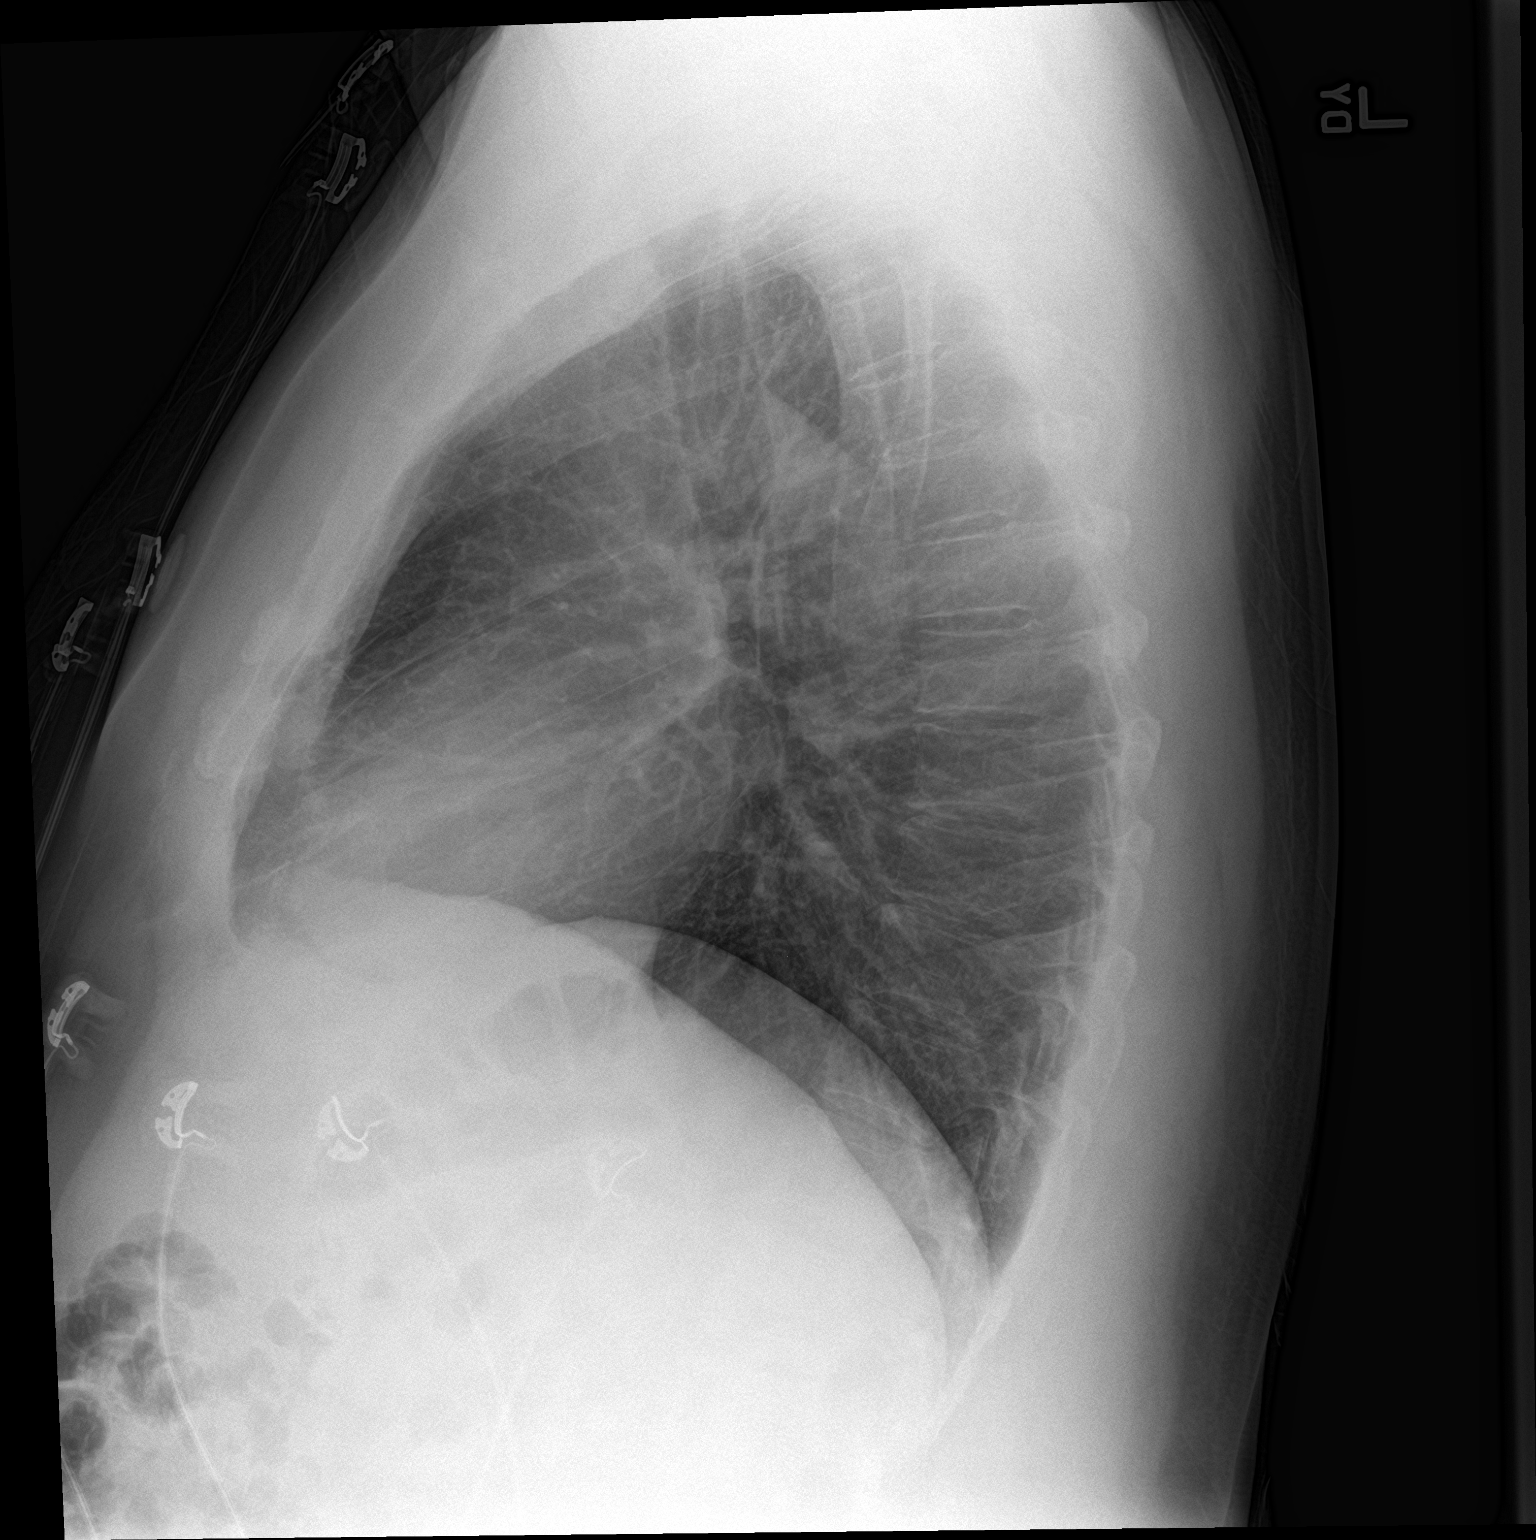

[2 of 2 positions shown; findings below may reference images not displayed]

FINDINGS: The heart size and mediastinal contours are within normal limits.
Both lungs are clear. The visualized skeletal structures are
unremarkable.
IMPRESSION: No active cardiopulmonary disease.

## 2021-09-21 IMAGING — CT CT CERVICAL SPINE W/O CM
3 of 4 series · 13 of 33 positions shown, 16 images · non-contrast
Comparison: None.

CLINICAL DATA: Fell from ladder, hit back of head

EXAM:
CT HEAD WITHOUT CONTRAST
CT MAXILLOFACIAL WITHOUT CONTRAST
CT CERVICAL SPINE WITHOUT CONTRAST
TECHNIQUE: Multidetector CT imaging of the head, cervical spine, and
maxillofacial structures were performed using the standard protocol
without intravenous contrast. Multiplanar CT image reconstructions
of the cervical spine and maxillofacial structures were also
generated.

[Series 5: sagittal bone · sagittal · 0.31mm/px · 5 of 87 slices shown, 6 images]
[im 29/87  bone]
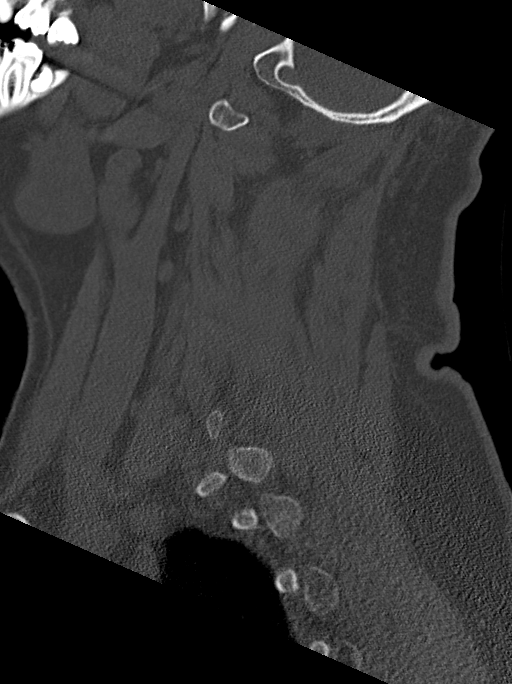
[im 36/87  bone]
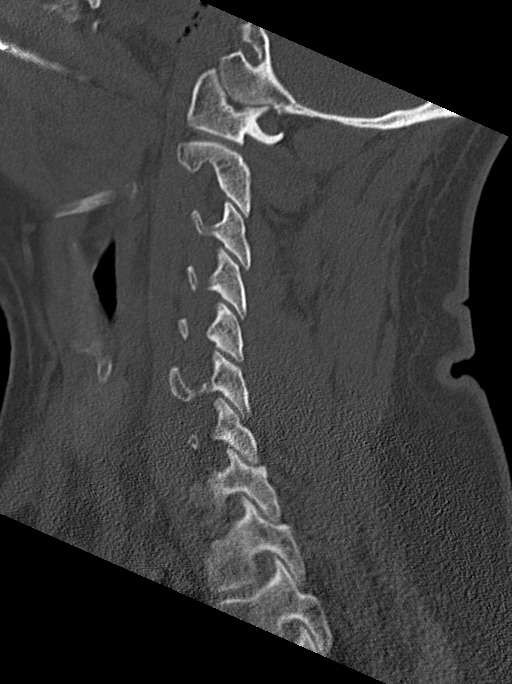
[im 44/87  soft-tissue]
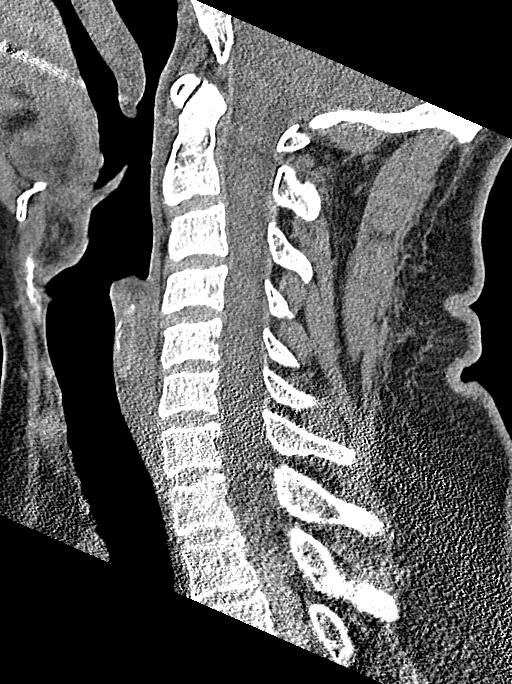
[im 44/87  bone]
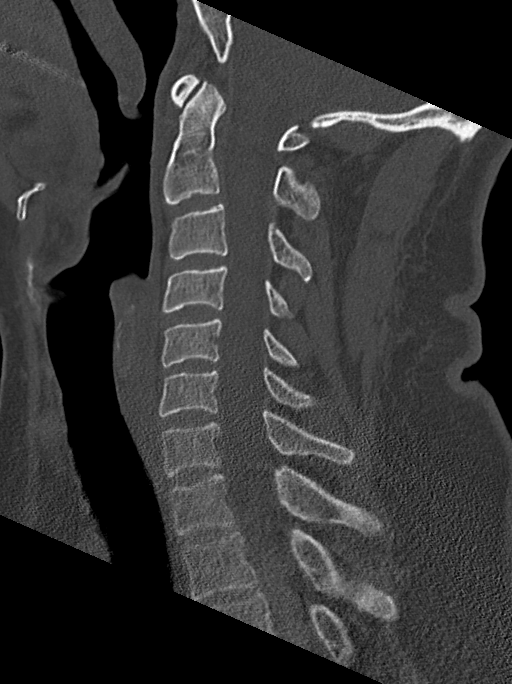
[im 51/87  bone]
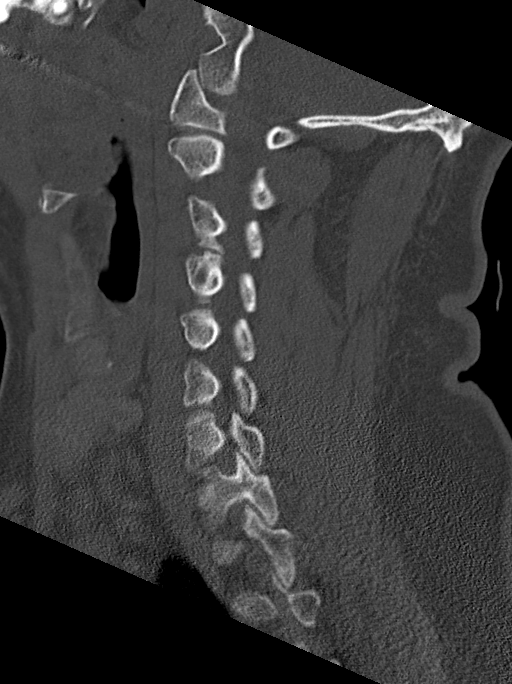
[im 58/87  bone]
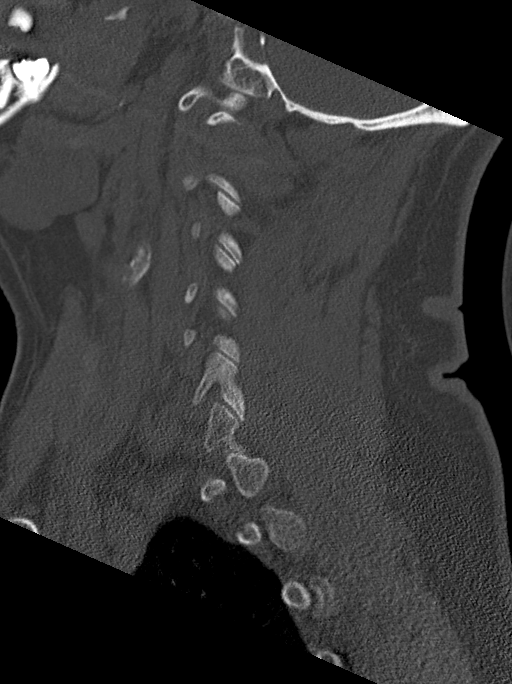

[Series 6: coronal bone · coronal · 0.34mm/px · 3 of 81 slices shown]
[im 22/81  bone]
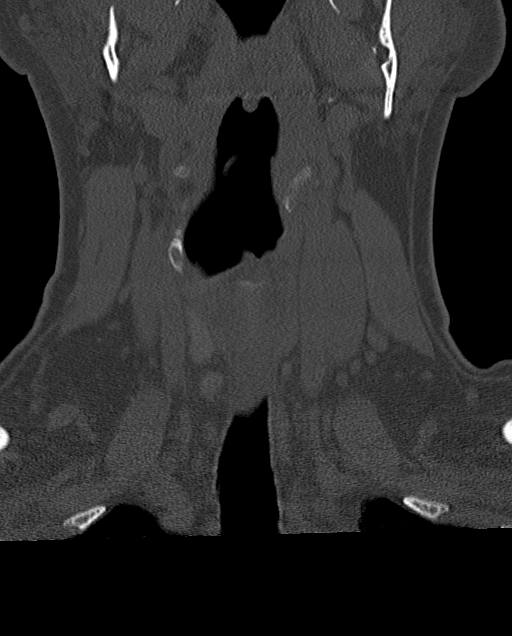
[im 34/81  bone]
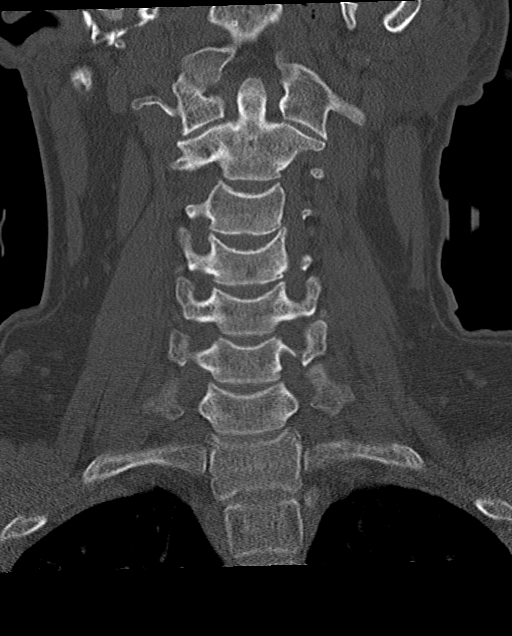
[im 47/81  bone]
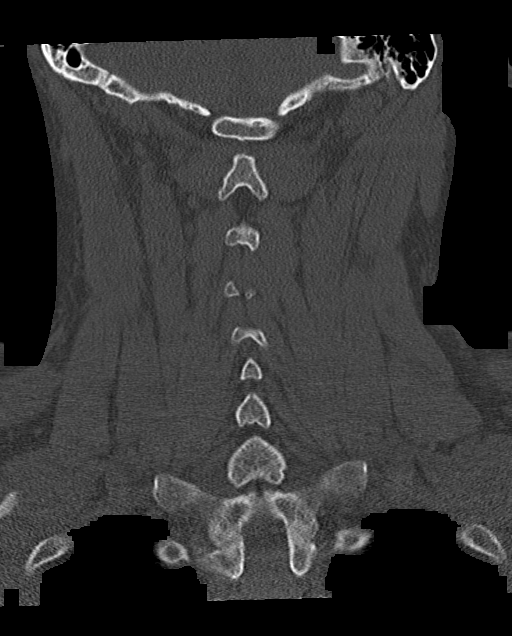

[Series 7: orthogonal axials · axial · 0.31mm/px · z∈[-280,-141]mm · 5 of 108 slices shown, 7 images]
[im 16/108  soft-tissue]
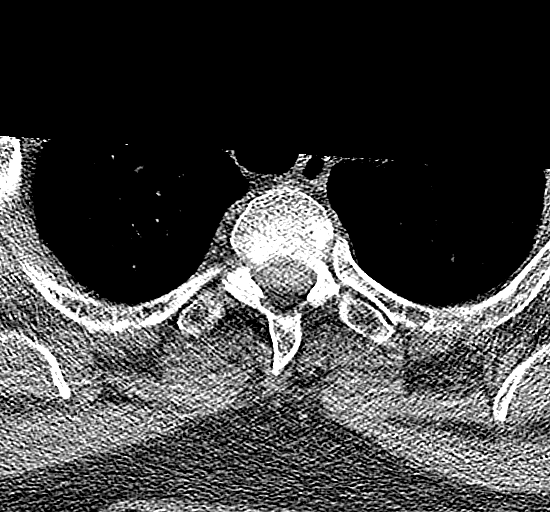
[im 16/108  bone]
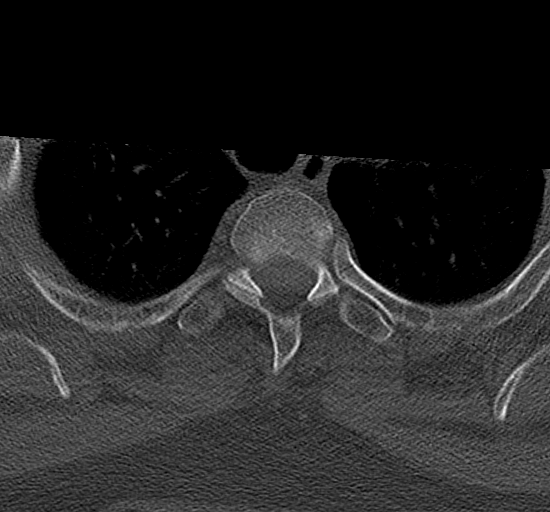
[im 31/108  bone]
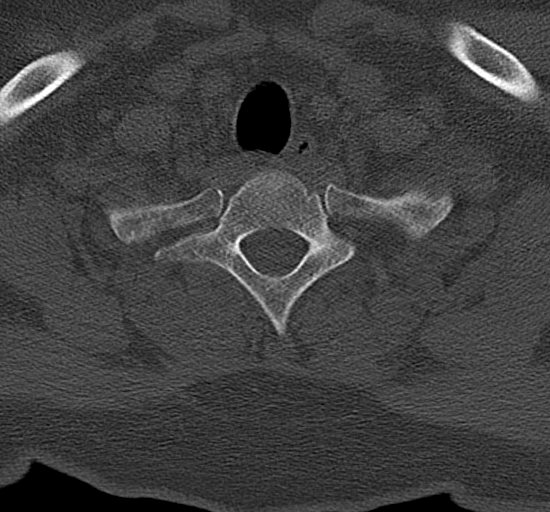
[im 62/108  bone]
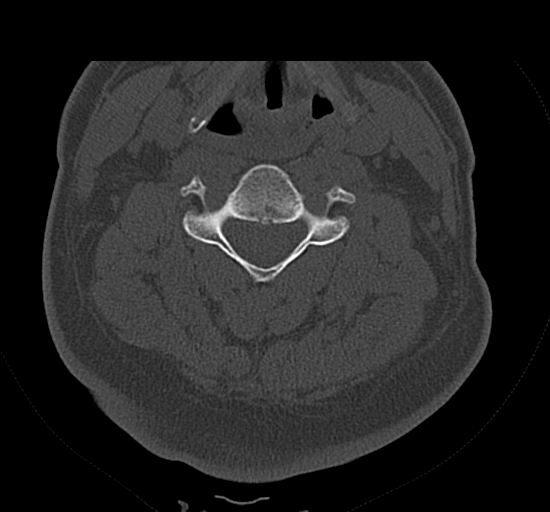
[im 77/108  bone]
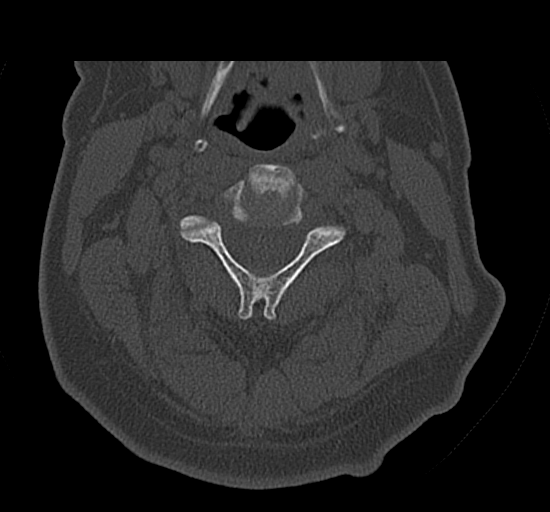
[im 92/108  soft-tissue]
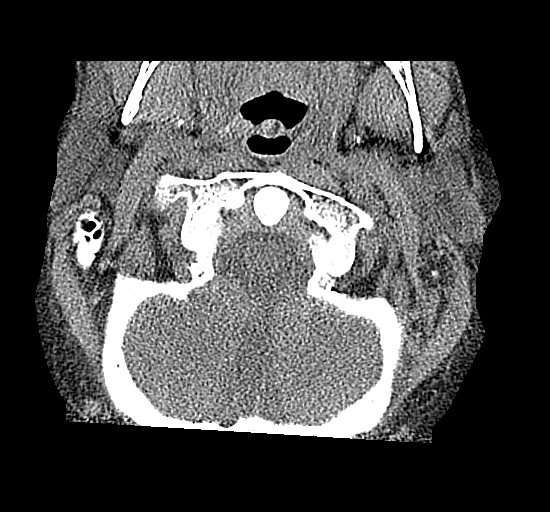
[im 92/108  bone]
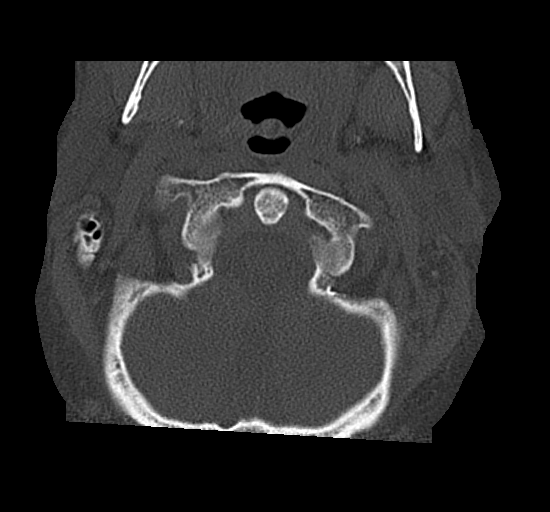

[13 of 33 positions shown; findings below may reference images not displayed]

FINDINGS: CT HEAD FINDINGS

Brain: No evidence of acute infarction, hemorrhage, hydrocephalus,
extra-axial collection or mass lesion/mass effect.

Vascular: No hyperdense vessel or unexpected calcification.

CT FACIAL BONES FINDINGS

Skull: Normal. Negative for fracture or focal lesion.

Facial bones: No displaced fractures or dislocations.

Sinuses/Orbits: No acute finding.

Other: None.

CT CERVICAL SPINE FINDINGS

Alignment: Normal.

Skull base and vertebrae: No acute fracture. No primary bone lesion
or focal pathologic process.

Soft tissues and spinal canal: No prevertebral fluid or swelling. No
visible canal hematoma.

Disc levels:  Intact.

Upper chest: Negative.

Other: None.
IMPRESSION: 1.  No acute intracranial pathology.

2.  No displaced fracture or dislocation of the facial bones.

3.  No fracture or static subluxation of the cervical spine.

## 2022-08-21 ENCOUNTER — Other Ambulatory Visit: Payer: Self-pay

## 2022-08-21 DIAGNOSIS — Z7189 Other specified counseling: Secondary | ICD-10-CM | POA: Diagnosis not present

## 2022-08-21 DIAGNOSIS — Z23 Encounter for immunization: Secondary | ICD-10-CM | POA: Diagnosis not present

## 2022-08-22 ENCOUNTER — Other Ambulatory Visit: Payer: Self-pay

## 2022-08-22 MED ORDER — ATOVAQUONE-PROGUANIL HCL 250-100 MG PO TABS
1.0000 | ORAL_TABLET | Freq: Every day | ORAL | 0 refills | Status: DC
  Filled 2022-08-22: qty 21, 21d supply, fill #0

## 2022-08-22 MED ORDER — VIVOTIF PO CPDR
1.0000 | DELAYED_RELEASE_CAPSULE | ORAL | 0 refills | Status: DC
  Filled 2022-08-22: qty 4, 8d supply, fill #0

## 2022-08-22 MED ORDER — AZITHROMYCIN 500 MG PO TABS
1000.0000 mg | ORAL_TABLET | ORAL | 0 refills | Status: DC
  Filled 2022-08-22: qty 2, 1d supply, fill #0

## 2022-08-23 ENCOUNTER — Encounter: Payer: Self-pay | Admitting: Family Medicine

## 2022-08-25 ENCOUNTER — Telehealth: Payer: Self-pay | Admitting: Family Medicine

## 2022-08-25 ENCOUNTER — Other Ambulatory Visit: Payer: Self-pay

## 2022-08-25 NOTE — Telephone Encounter (Signed)
Pt called to scheduled cpe. Told pt he hasn't been seen since 2020 so he'd have to see if [facility] would allow him to re-establish care. Pt is asking if [facility] would accept him back as a pt? call back # [phone]

## 2022-08-27 NOTE — Telephone Encounter (Signed)
Please, yes.  Thanks.

## 2022-09-21 ENCOUNTER — Other Ambulatory Visit: Payer: Self-pay

## 2022-09-21 MED ORDER — CEFDINIR 300 MG PO CAPS
ORAL_CAPSULE | ORAL | 0 refills | Status: DC
  Filled 2022-09-21: qty 14, 7d supply, fill #0

## 2022-09-22 ENCOUNTER — Other Ambulatory Visit: Payer: Self-pay

## 2022-11-07 ENCOUNTER — Encounter: Payer: PRIVATE HEALTH INSURANCE | Admitting: Family Medicine

## 2022-11-07 ENCOUNTER — Ambulatory Visit (INDEPENDENT_AMBULATORY_CARE_PROVIDER_SITE_OTHER): Payer: BC Managed Care – PPO | Admitting: Family Medicine

## 2022-11-07 ENCOUNTER — Encounter: Payer: Self-pay | Admitting: Family Medicine

## 2022-11-07 VITALS — BP 120/66 | HR 90 | Temp 98.3°F | Ht 69.0 in | Wt 252.0 lb

## 2022-11-07 DIAGNOSIS — Z0000 Encounter for general adult medical examination without abnormal findings: Secondary | ICD-10-CM

## 2022-11-07 DIAGNOSIS — E785 Hyperlipidemia, unspecified: Secondary | ICD-10-CM

## 2022-11-07 DIAGNOSIS — Z7189 Other specified counseling: Secondary | ICD-10-CM

## 2022-11-07 NOTE — Patient Instructions (Addendum)
Lab visit when possible, fasting.  Update me as needed.  Thanks for your effort. Take care.  Glad to see you.

## 2022-11-07 NOTE — Progress Notes (Unsigned)
CPE- See plan.  Routine anticipatory guidance given to patient.  See health maintenance.  The possibility exists that previously documented standard health maintenance information may have been brought forward from a previous encounter into this note.  If needed, that same information has been updated to reflect the current situation based on today's encounter.    DTaP 2024 Flu shot done at work each year.  Pneumonia and shingles not due.  Not due for colon or prostate cancer screening.  Advanced directive discussed with patient. Wife designated if patient were incapacitated.  HIV and HCV screening declined, he is low risk.  Diet and exercise discussed with patient.  He has exercise bike, using that.  Working on diet.    Will return for labs.  He is putting up with seasonal allergies, taking allegra prn.    PMH and SH reviewed  Meds, vitals, and allergies reviewed.   ROS: Per HPI.  Unless specifically indicated otherwise in HPI, the patient denies:  General: fever. Eyes: acute vision changes ENT: sore throat Cardiovascular: chest pain Respiratory: SOB GI: vomiting GU: dysuria Musculoskeletal: acute back pain Derm: acute rash Neuro: acute motor dysfunction Psych: worsening mood Endocrine: polydipsia Heme: bleeding Allergy: hayfever  GEN: nad, alert and oriented HEENT: ncat NECK: supple w/o LA CV: rrr. PULM: ctab, no inc wob ABD: soft, +bs EXT: no edema SKIN: well perfused.

## 2022-11-09 DIAGNOSIS — Z0000 Encounter for general adult medical examination without abnormal findings: Secondary | ICD-10-CM | POA: Insufficient documentation

## 2022-11-09 NOTE — Assessment & Plan Note (Signed)
DTaP 2024 Flu shot done at work each year.  Pneumonia and shingles not due.  Not due for colon or prostate cancer screening.  Advanced directive discussed with patient. Wife designated if patient were incapacitated.  HIV and HCV screening declined, he is low risk.  Diet and exercise discussed with patient.  He has exercise bike, using that.  Working on diet.

## 2022-11-09 NOTE — Assessment & Plan Note (Signed)
Advanced directive discussed with patient.  Wife designated if patient were incapacitated. °

## 2022-11-14 ENCOUNTER — Other Ambulatory Visit (INDEPENDENT_AMBULATORY_CARE_PROVIDER_SITE_OTHER): Payer: BC Managed Care – PPO

## 2022-11-14 DIAGNOSIS — E785 Hyperlipidemia, unspecified: Secondary | ICD-10-CM

## 2022-11-14 LAB — COMPREHENSIVE METABOLIC PANEL
ALT: 30 U/L (ref 0–53)
AST: 18 U/L (ref 0–37)
Albumin: 4.3 g/dL (ref 3.5–5.2)
Alkaline Phosphatase: 86 U/L (ref 39–117)
BUN: 14 mg/dL (ref 6–23)
CO2: 28 mEq/L (ref 19–32)
Calcium: 9.2 mg/dL (ref 8.4–10.5)
Chloride: 103 mEq/L (ref 96–112)
Creatinine, Ser: 0.94 mg/dL (ref 0.40–1.50)
GFR: 100.32 mL/min (ref >60.00)
Glucose, Bld: 84 mg/dL (ref 70–99)
Potassium: 3.8 mEq/L (ref 3.5–5.1)
Sodium: 138 mEq/L (ref 135–145)
Total Bilirubin: 0.8 mg/dL (ref 0.2–1.2)
Total Protein: 7.4 g/dL (ref 6.0–8.3)

## 2022-11-14 LAB — LIPID PANEL
Cholesterol: 185 mg/dL (ref 0–200)
HDL: 34.8 mg/dL — ABNORMAL LOW (ref >39.00)
LDL Cholesterol: 128 mg/dL — ABNORMAL HIGH (ref 0–99)
NonHDL: 150.42
Total CHOL/HDL Ratio: 5
Triglycerides: 112 mg/dL (ref 0.0–149.0)
VLDL: 22.4 mg/dL (ref 0.0–40.0)

## 2022-12-11 ENCOUNTER — Other Ambulatory Visit: Payer: Self-pay

## 2022-12-11 MED ORDER — DOXYCYCLINE HYCLATE 100 MG PO CAPS
100.0000 mg | ORAL_CAPSULE | Freq: Two times a day (BID) | ORAL | 0 refills | Status: DC
  Filled 2022-12-11: qty 14, 7d supply, fill #0

## 2022-12-11 MED ORDER — PREDNISONE 20 MG PO TABS
ORAL_TABLET | ORAL | 0 refills | Status: AC
  Filled 2022-12-11: qty 12, 6d supply, fill #0

## 2022-12-11 MED ORDER — ALBUTEROL SULFATE HFA 108 (90 BASE) MCG/ACT IN AERS
2.0000 | INHALATION_SPRAY | Freq: Four times a day (QID) | RESPIRATORY_TRACT | 0 refills | Status: DC | PRN
  Filled 2022-12-11: qty 6.7, 30d supply, fill #0

## 2022-12-12 ENCOUNTER — Ambulatory Visit: Payer: BC Managed Care – PPO | Admitting: Family Medicine

## 2022-12-12 ENCOUNTER — Other Ambulatory Visit: Payer: Self-pay

## 2023-01-12 ENCOUNTER — Other Ambulatory Visit: Payer: Self-pay

## 2023-01-12 ENCOUNTER — Encounter: Payer: Self-pay | Admitting: Internal Medicine

## 2023-01-12 ENCOUNTER — Ambulatory Visit (INDEPENDENT_AMBULATORY_CARE_PROVIDER_SITE_OTHER): Payer: BC Managed Care – PPO | Admitting: Internal Medicine

## 2023-01-12 VITALS — BP 116/84 | HR 87 | Temp 98.1°F | Ht 69.0 in | Wt 255.0 lb

## 2023-01-12 DIAGNOSIS — H109 Unspecified conjunctivitis: Secondary | ICD-10-CM | POA: Insufficient documentation

## 2023-01-12 DIAGNOSIS — H10023 Other mucopurulent conjunctivitis, bilateral: Secondary | ICD-10-CM

## 2023-01-12 MED ORDER — OFLOXACIN 0.3 % OP SOLN
2.0000 [drp] | Freq: Four times a day (QID) | OPHTHALMIC | 1 refills | Status: DC
  Filled 2023-01-12: qty 5, 13d supply, fill #0
  Filled 2023-01-29: qty 5, 13d supply, fill #1

## 2023-01-12 MED ORDER — DOXYCYCLINE HYCLATE 100 MG PO TABS
100.0000 mg | ORAL_TABLET | Freq: Two times a day (BID) | ORAL | 0 refills | Status: DC
  Filled 2023-01-12: qty 14, 7d supply, fill #0

## 2023-01-12 NOTE — Assessment & Plan Note (Signed)
Will switch to ofloxacin Use systemic doxy in case he has concommitant sinus infection If ongoing symptoms--next step is eye doctor

## 2023-01-12 NOTE — Progress Notes (Signed)
Subjective:    Patient ID: Charles Gonzales, male    DOB: 08/15/1980, 42 y.o.   MRN: [id number]  HPI Here due to ongoing pink eye  Started with eye redness --thought it was just allergy (just over a week) Multiple family members had pink eye 3 days ago--more itching 2 days ago--burning but not very red--till that night Started with yellow green discharge Matted shut the next morning--left eye first---then right today  Did try some polytrim from family Actually seemed to make it worse  No change in vision--other than from the gunk No photosensitivity Some tenderness since yesterday  Current Outpatient Medications on File Prior to Visit  Medication Sig Dispense Refill   fexofenadine (ALLEGRA) 180 MG tablet Take 180 mg by mouth daily as needed for allergies or rhinitis.     No current facility-administered medications on file prior to visit.    Allergies  Allergen Reactions   Clindamycin/Lincomycin Shortness Of Breath   Amoxicillin Rash    Past Medical History:  Diagnosis Date   Allergy Childhood   Carpal tunnel syndrome    IBS (irritable bowel syndrome)    Lichen planus    Peyronie disease    Rectal fissure     Past Surgical History:  Procedure Laterality Date   CARPAL TUNNEL RELEASE     CIRCUMCISION     repeat at age 29    Family History  Problem Relation Age of Onset   Hyperlipidemia Father    Hypertension Father    Heart disease Paternal Grandfather    Diabetes Paternal Grandfather    Kidney disease Paternal Grandfather    Colon cancer Neg Hx    Prostate cancer Neg Hx     Social History   Socioeconomic History   Marital status: Married    Spouse name: Not on file   Number of children: Not on file   Years of education: Not on file   Highest education level: Not on file  Occupational History   Not on file  Tobacco Use   Smoking status: Never   Smokeless tobacco: Never  Substance and Sexual Activity   Alcohol use: No   Drug use: No    Sexual activity: Yes    Birth control/protection: None  Other Topics Concern   Not on file  Social History Narrative   From [place]. Married 2001. 6 children, oldest born in 2004, youngest born in 2017.   [employer], [employer] school at [employer]   Physician assistant, previously did chronic pain work, now does wound care work   Social Determinants of Corporate investment banker Strain: Not on [employer] Insecurity: Not on file  Transportation Needs: Not on file  Physical Activity: Not on file  Stress: Not on file  Social Connections: Not on file  Intimate Partner Violence: Not on file   Review of Systems No fever Some frontal sinus pressure Some drainage and yellow/green     Objective:   Physical Exam Constitutional:      Appearance: Normal appearance.  HENT:     Head:     Comments: Mild frontal sinusitis    Mouth/Throat:     Pharynx: No oropharyngeal exudate or posterior oropharyngeal erythema.  Eyes:     Comments: Marked injection and edema in both conjunctiva  Pulmonary:     Effort: Pulmonary effort is normal.     Breath sounds: Normal breath sounds. No wheezing or rales.  Musculoskeletal:     Cervical back: Neck supple.  Lymphadenopathy:     Cervical: No cervical adenopathy.  Neurological:     Mental Status: He is alert.            Assessment & Plan:

## 2023-01-13 ENCOUNTER — Ambulatory Visit: Payer: BC Managed Care – PPO | Admitting: Family Medicine

## 2023-06-01 ENCOUNTER — Encounter: Payer: Self-pay | Admitting: Podiatry

## 2023-06-01 ENCOUNTER — Ambulatory Visit (INDEPENDENT_AMBULATORY_CARE_PROVIDER_SITE_OTHER): Payer: BC Managed Care – PPO | Admitting: Podiatry

## 2023-06-01 ENCOUNTER — Ambulatory Visit (INDEPENDENT_AMBULATORY_CARE_PROVIDER_SITE_OTHER): Payer: BC Managed Care – PPO

## 2023-06-01 DIAGNOSIS — M25471 Effusion, right ankle: Secondary | ICD-10-CM

## 2023-06-01 DIAGNOSIS — S8264XA Nondisplaced fracture of lateral malleolus of right fibula, initial encounter for closed fracture: Secondary | ICD-10-CM

## 2023-06-01 DIAGNOSIS — M79671 Pain in right foot: Secondary | ICD-10-CM

## 2023-06-01 NOTE — Progress Notes (Signed)
   Chief Complaint  Patient presents with   Foot Injury    PATIENT STATES THAT HE TWIST HIS RIGHT LATERAL ANKLE , HIS PAIN LEVEL IS ABOUT A 10 STANDING, HIS RIGHT ANKLE IS SWELLING AND PATIENT STATES THAT HE HAS BEEN TAKING ALIVE FOR PAIN .    HPI: 42 y.o. male presenting today as a new patient for evaluation of an injury to the right ankle.  Yesterday evening the patient was at a corn maze with his 45-year-old daughter on his shoulders when he tripped and fell on uneven ground.  He had immediate pain and tenderness to the lateral aspect of the right ankle and unable to bear weight without significant pain. Presenting this morning for urgent work in   Past Medical History:  Diagnosis Date   Allergy Childhood   Carpal tunnel syndrome    IBS (irritable bowel syndrome)    Lichen planus    Peyronie disease    Rectal fissure     Past Surgical History:  Procedure Laterality Date   CARPAL TUNNEL RELEASE     CIRCUMCISION     repeat at age 31    Allergies  Allergen Reactions   Clindamycin/Lincomycin Shortness Of Breath   Amoxicillin Rash     Physical Exam: General: The patient is alert and oriented x3 in no acute distress.  Dermatology: Skin is warm, dry and supple bilateral lower extremities.   Vascular: Palpable pedal pulses bilaterally. Capillary refill within normal limits.  Edema with ecchymosis noted lateral aspect of the right ankle  Neurological: Grossly intact via light touch  Musculoskeletal Exam: Foot and ankle is in gross alignment.  Significant edema and swelling around the right lateral aspect of the right ankle  Radiographic Exam RT ankle 06/01/2023:  Normal osseous mineralization. Joint spaces preserved.  Nondisplaced transverse fracture to the distal tip of the fibula noted  Assessment/Plan of Care: 1.  Fibular fracture RT ankle, closed, nondisplaced, initial encounter  -Patient evaluated.  X-rays reviewed -Pursue conservative care. -Cam boot dispensed.  NWB  x 6 weeks.  -Patient states that he will get a knee scooter today -OTC naproxen as needed -RICE -Ace wrap's were also provided.  Recommend compression daily -Return to clinic 6 weeks follow-up x-ray       Felecia Shelling, DPM [facility]  Dr. Felecia Shelling, DPM    [address] [place], [place] [address]                Office [phone]  Fax [phone]

## 2023-07-17 ENCOUNTER — Ambulatory Visit (INDEPENDENT_AMBULATORY_CARE_PROVIDER_SITE_OTHER): Payer: BC Managed Care – PPO

## 2023-07-17 ENCOUNTER — Encounter: Payer: Self-pay | Admitting: Podiatry

## 2023-07-17 ENCOUNTER — Ambulatory Visit (INDEPENDENT_AMBULATORY_CARE_PROVIDER_SITE_OTHER): Payer: BC Managed Care – PPO | Admitting: Podiatry

## 2023-07-17 DIAGNOSIS — S8264XA Nondisplaced fracture of lateral malleolus of right fibula, initial encounter for closed fracture: Secondary | ICD-10-CM | POA: Diagnosis not present

## 2023-10-05 LAB — HEPATIC FUNCTION PANEL
ALT: 30 U/L (ref 10–40)
AST: 15 (ref 14–40)

## 2023-10-05 LAB — TSH: TSH: 9.4 — AB (ref 0.41–5.90)

## 2023-10-05 LAB — LIPID PANEL
Cholesterol: 212 — AB (ref 0–200)
HDL: 38 (ref 35–70)
LDL Cholesterol: 150
Triglycerides: 131 (ref 40–160)

## 2023-10-05 LAB — CBC AND DIFFERENTIAL
Hemoglobin: 15 (ref 13.5–17.5)
Platelets: 264 10*3/uL (ref 150–400)

## 2023-10-05 LAB — PSA: PSA: 0.5

## 2023-10-05 LAB — BASIC METABOLIC PANEL WITH GFR
Creatinine: 0.9 (ref 0.6–1.3)
Glucose: 88

## 2023-10-05 LAB — HEMOGLOBIN A1C: Hemoglobin A1C: 5.7

## 2023-12-18 ENCOUNTER — Encounter: Payer: Self-pay | Admitting: Family Medicine

## 2023-12-18 ENCOUNTER — Ambulatory Visit (INDEPENDENT_AMBULATORY_CARE_PROVIDER_SITE_OTHER): Admitting: Family Medicine

## 2023-12-18 VITALS — BP 128/80 | HR 82 | Temp 98.0°F | Ht 69.0 in | Wt 256.0 lb

## 2023-12-18 DIAGNOSIS — K602 Anal fissure, unspecified: Secondary | ICD-10-CM

## 2023-12-18 DIAGNOSIS — Z7189 Other specified counseling: Secondary | ICD-10-CM

## 2023-12-18 DIAGNOSIS — R7989 Other specified abnormal findings of blood chemistry: Secondary | ICD-10-CM

## 2023-12-18 DIAGNOSIS — Z0000 Encounter for general adult medical examination without abnormal findings: Secondary | ICD-10-CM | POA: Diagnosis not present

## 2023-12-18 MED ORDER — NONFORMULARY OR COMPOUNDED ITEM: Status: AC

## 2023-12-18 NOTE — Patient Instructions (Addendum)
 Go to the lab on the way out.   If you have mychart we'll likely use that to update you.    Take care.  Glad to see you. Let me know if you can't get the diltiazem cream or if it isn't working.

## 2023-12-18 NOTE — Progress Notes (Unsigned)
 CPE- See plan.  Routine anticipatory guidance given to patient.  See health maintenance.  The possibility exists that previously documented standard health maintenance information may have been brought forward from a previous encounter into this note.  If needed, that same information has been updated to reflect the current situation based on today's encounter.    DTaP 2024 Flu shot done at work each year.  Pneumonia and shingles not due.  Not due for colon or prostate cancer screening.  Advanced directive discussed with patient. Wife designated if patient were incapacitated.  HIV and HCV screening declined, he is low risk.  Diet and exercise discussed with patient.    Elevated TSH.  Some dry skin on the face, over the last few years.  Labs d/w pt. follow-up labs pending.  He has lack of smell since prev covid vaccine then covid illness.  Taste is altered but not totally absent.    He had less seasonal allergies this year, taking allegra prn. Removing carpet at home helped.    D/w pt about rectal fissure treatment.  Prescription done for diltiazem 2% cream. Hand written and given to patient. Apply to affected area twice a day as needed. Dispensed 30 g, 1 refill.   PMH and SH reviewed   Meds, vitals, and allergies reviewed.    ROS: Per HPI.  Unless specifically indicated otherwise in HPI, the patient denies:   General: fever. Eyes: acute vision changes ENT: sore throat Cardiovascular: chest pain Respiratory: SOB GI: vomiting GU: dysuria Musculoskeletal: acute back pain Derm: acute rash Neuro: acute motor dysfunction Psych: worsening mood Endocrine: polydipsia Heme: bleeding Allergy: hayfever   GEN: nad, alert and oriented HEENT: ncat NECK: supple w/o LA, I do not feel a thyroid mass. CV: rrr. PULM: ctab, no inc wob ABD: soft, +bs EXT: no edema SKIN: well perfused.

## 2023-12-21 ENCOUNTER — Ambulatory Visit: Payer: Self-pay | Admitting: Family Medicine

## 2023-12-21 DIAGNOSIS — E039 Hypothyroidism, unspecified: Secondary | ICD-10-CM | POA: Insufficient documentation

## 2023-12-21 DIAGNOSIS — R7989 Other specified abnormal findings of blood chemistry: Secondary | ICD-10-CM | POA: Insufficient documentation

## 2023-12-21 HISTORY — DX: Hypothyroidism, unspecified: E03.9

## 2023-12-21 NOTE — Assessment & Plan Note (Signed)
 D/w pt about rectal fissure treatment.  Prescription done for diltiazem 2% cream. Hand written and given to patient. Apply to affected area twice a day as needed. Dispensed 30 g, 1 refill.  Can update me as needed.

## 2023-12-21 NOTE — Assessment & Plan Note (Signed)
Advanced directive discussed with patient.  Wife designated if patient were incapacitated. ?

## 2023-12-21 NOTE — Assessment & Plan Note (Signed)
 DTaP 2024 Flu shot done at work each year.  Pneumonia and shingles not due.  Not due for colon or prostate cancer screening.  Advanced directive discussed with patient. Wife designated if patient were incapacitated.  HIV and HCV screening declined, he is low risk.  Diet and exercise discussed with patient.

## 2023-12-21 NOTE — Assessment & Plan Note (Signed)
See notes on follow-up labs. 

## 2023-12-22 ENCOUNTER — Encounter: Payer: Self-pay | Admitting: Family Medicine

## 2023-12-22 LAB — T4, FREE: Free T4: 1.1 ng/dL (ref 0.8–1.8)

## 2023-12-22 LAB — TSH: TSH: 5.94 m[IU]/L — ABNORMAL HIGH (ref 0.40–4.50)

## 2023-12-22 LAB — THYROID PEROXIDASE ANTIBODIES (TPO) (REFL): Thyroperoxidase Ab SerPl-aCnc: 1 [IU]/mL (ref <9)

## 2023-12-23 ENCOUNTER — Other Ambulatory Visit: Payer: Self-pay

## 2023-12-23 ENCOUNTER — Other Ambulatory Visit: Payer: Self-pay | Admitting: Family Medicine

## 2023-12-23 DIAGNOSIS — Z6836 Body mass index (BMI) 36.0-36.9, adult: Secondary | ICD-10-CM | POA: Diagnosis not present

## 2023-12-23 DIAGNOSIS — J029 Acute pharyngitis, unspecified: Secondary | ICD-10-CM | POA: Diagnosis not present

## 2023-12-23 DIAGNOSIS — R7989 Other specified abnormal findings of blood chemistry: Secondary | ICD-10-CM

## 2023-12-23 DIAGNOSIS — J018 Other acute sinusitis: Secondary | ICD-10-CM | POA: Diagnosis not present

## 2023-12-23 MED ORDER — DOXYCYCLINE HYCLATE 100 MG PO TABS
100.0000 mg | ORAL_TABLET | Freq: Two times a day (BID) | ORAL | 0 refills | Status: DC
  Filled 2023-12-23: qty 14, 7d supply, fill #0

## 2023-12-27 MED ORDER — LEVOTHYROXINE SODIUM 25 MCG PO TABS
25.0000 ug | ORAL_TABLET | Freq: Every day | ORAL | 3 refills | Status: AC
  Filled 2023-12-27: qty 90, 90d supply, fill #0
  Filled 2024-03-22: qty 90, 90d supply, fill #1
  Filled 2024-06-17 (×2): qty 90, 90d supply, fill #2

## 2023-12-28 ENCOUNTER — Other Ambulatory Visit: Payer: Self-pay

## 2024-02-04 ENCOUNTER — Other Ambulatory Visit: Payer: Self-pay | Admitting: Medical Genetics

## 2024-03-23 ENCOUNTER — Other Ambulatory Visit
Admission: RE | Admit: 2024-03-23 | Discharge: 2024-03-23 | Disposition: A | Discharge status: Home / Self Care | Payer: Self-pay | Source: Ambulatory Visit | Attending: Medical Genetics | Admitting: Medical Genetics

## 2024-04-21 LAB — GENECONNECT MOLECULAR SCREEN: Genetic Analysis Overall Interpretation: POSITIVE — AB

## 2024-04-23 ENCOUNTER — Telehealth: Payer: Self-pay | Admitting: Medical Genetics

## 2024-04-23 DIAGNOSIS — Z1509 Genetic susceptibility to other malignant neoplasm: Secondary | ICD-10-CM | POA: Insufficient documentation

## 2024-04-23 NOTE — Telephone Encounter (Signed)
 [facility] Positive Result Note 04/23/2024 6:24 PM  FIRST ATTEMPT: Confirmed I was speaking with Andre Cherylynn Dustman III [id number] by using name and DOB. Informed participant the reason for this call is to provide results for the above study. Results revealed Lynch Syndrome. 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 All questions were answered, and participant was thanked for their time and support of the above study. Participant was encouraged to contact [facility] if they have any further questions or concerns.

## 2024-04-24 DIAGNOSIS — Z1509 Genetic susceptibility to other malignant neoplasm: Secondary | ICD-10-CM | POA: Insufficient documentation

## 2024-04-24 NOTE — Telephone Encounter (Signed)
 Please check with patient.  I got a message that he had genetic testing done that revealed diagnosis of Lynch syndrome.  Offer office visit.  Offer GI referral along with PSA/urinalysis here.  Please let me know if he consents for the referral and the testing so I can put in the orders.  Thanks.

## 2024-04-25 NOTE — Telephone Encounter (Signed)
 This is duplicate message. I have added to open message.

## 2024-06-05 ENCOUNTER — Encounter: Payer: Self-pay | Admitting: Family Medicine

## 2024-06-08 ENCOUNTER — Other Ambulatory Visit: Payer: Self-pay | Admitting: Family Medicine

## 2024-06-08 DIAGNOSIS — N529 Male erectile dysfunction, unspecified: Secondary | ICD-10-CM

## 2024-06-10 ENCOUNTER — Other Ambulatory Visit (INDEPENDENT_AMBULATORY_CARE_PROVIDER_SITE_OTHER)

## 2024-06-10 DIAGNOSIS — R7989 Other specified abnormal findings of blood chemistry: Secondary | ICD-10-CM | POA: Diagnosis not present

## 2024-06-10 DIAGNOSIS — N529 Male erectile dysfunction, unspecified: Secondary | ICD-10-CM

## 2024-06-10 DIAGNOSIS — E291 Testicular hypofunction: Secondary | ICD-10-CM | POA: Diagnosis not present

## 2024-06-10 NOTE — Addendum Note (Signed)
 Addended by: ISADORA RAISIN on: 06/10/2024 02:13 PM   Modules accepted: Orders

## 2024-06-11 LAB — TESTOSTERONE: Testosterone: 129 ng/dL — ABNORMAL LOW (ref 250–827)

## 2024-06-11 LAB — TSH: TSH: 4.31 m[IU]/L (ref 0.40–4.50)

## 2024-06-12 ENCOUNTER — Ambulatory Visit: Payer: Self-pay | Admitting: Family Medicine

## 2024-06-17 ENCOUNTER — Ambulatory Visit: Admitting: Family Medicine

## 2024-06-17 ENCOUNTER — Other Ambulatory Visit: Payer: Self-pay

## 2024-06-17 VITALS — BP 126/80 | HR 89 | Temp 98.0°F | Ht 69.0 in | Wt 265.1 lb

## 2024-06-17 DIAGNOSIS — N529 Male erectile dysfunction, unspecified: Secondary | ICD-10-CM

## 2024-06-17 DIAGNOSIS — Z1501 Genetic susceptibility to malignant neoplasm of breast: Secondary | ICD-10-CM

## 2024-06-17 DIAGNOSIS — Z1509 Genetic susceptibility to other malignant neoplasm: Secondary | ICD-10-CM | POA: Diagnosis not present

## 2024-06-17 DIAGNOSIS — R21 Rash and other nonspecific skin eruption: Secondary | ICD-10-CM

## 2024-06-17 DIAGNOSIS — Z15060 Genetic susceptibility to colorectal cancer: Secondary | ICD-10-CM

## 2024-06-17 DIAGNOSIS — Z1507 Genetic susceptibility to malignant neoplasm of urinary tract: Secondary | ICD-10-CM

## 2024-06-17 DIAGNOSIS — Z15068 Genetic susceptibility to other malignant neoplasm of digestive system: Secondary | ICD-10-CM

## 2024-06-17 DIAGNOSIS — Z1211 Encounter for screening for malignant neoplasm of colon: Secondary | ICD-10-CM

## 2024-06-17 DIAGNOSIS — E039 Hypothyroidism, unspecified: Secondary | ICD-10-CM

## 2024-06-17 LAB — URINALYSIS, ROUTINE W REFLEX MICROSCOPIC
Bilirubin Urine: NEGATIVE
Hgb urine dipstick: NEGATIVE
Ketones, ur: NEGATIVE
Leukocytes,Ua: NEGATIVE
Nitrite: NEGATIVE
RBC / HPF: NONE SEEN (ref 0–?)
Specific Gravity, Urine: 1.025 (ref 1.000–1.030)
Total Protein, Urine: NEGATIVE
Urine Glucose: NEGATIVE
Urobilinogen, UA: 0.2 (ref 0.0–1.0)
WBC, UA: NONE SEEN (ref 0–?)
pH: 5.5 (ref 5.0–8.0)

## 2024-06-17 LAB — TESTOSTERONE: Testosterone: 164.29 ng/dL — ABNORMAL LOW (ref 300.00–890.00)

## 2024-06-17 LAB — LUTEINIZING HORMONE: LH: 2.26 m[IU]/mL (ref 1.50–9.30)

## 2024-06-17 LAB — PSA: PSA: 0.37 ng/mL (ref 0.10–4.00)

## 2024-06-17 MED ORDER — SILDENAFIL CITRATE 20 MG PO TABS
20.0000 mg | ORAL_TABLET | Freq: Every day | ORAL | 12 refills | Status: AC | PRN
  Filled 2024-06-17: qty 50, 10d supply, fill #0

## 2024-06-17 NOTE — Progress Notes (Unsigned)
 D/w pt about lynch syndrome testing and f/u screening.  D/w pt about GI referral.  Ordered.    D/w pt about low testosterone  level.  Recheck labs pending.  ED noted.  No tx yet.    Prev pain from Peyronie's resolved.    His TSH is wnl.  Sense of smell improved in the meantime.  HA improved on tx.    Occ rash on the B wrists/orbit.  Improved with hydrocortisone.    Meds, vitals, and allergies reviewed.   ROS: Per HPI unless specifically indicated in ROS section   GEN: nad, alert and oriented HEENT: ncat NECK: supple w/o LA CV: rrr. PULM: ctab, no inc wob ABD: soft, +bs EXT: no edema SKIN: well perfused.

## 2024-06-17 NOTE — Patient Instructions (Signed)
 Go to the lab on the way out.   If you have mychart we'll likely use that to update you.    Take care.  Glad to see you. Let me know if you have more trouble with rash.

## 2024-06-19 ENCOUNTER — Encounter: Payer: Self-pay | Admitting: Family Medicine

## 2024-06-19 ENCOUNTER — Ambulatory Visit: Payer: Self-pay | Admitting: Family Medicine

## 2024-06-19 DIAGNOSIS — N529 Male erectile dysfunction, unspecified: Secondary | ICD-10-CM | POA: Insufficient documentation

## 2024-06-19 NOTE — Assessment & Plan Note (Addendum)
 Refer to GI.  Discussed routine screening.  See notes on labs.

## 2024-06-19 NOTE — Assessment & Plan Note (Signed)
 See notes on labs.  Can use sildenafil  as needed.

## 2024-06-19 NOTE — Assessment & Plan Note (Signed)
He can update me as needed.

## 2024-06-19 NOTE — Assessment & Plan Note (Signed)
 His TSH is wnl.  Sense of smell improved in the meantime.  HA improved on tx.   continue levothyroxine  25 mcg/day.

## 2024-06-20 ENCOUNTER — Other Ambulatory Visit: Payer: Self-pay

## 2024-06-30 ENCOUNTER — Encounter: Payer: Self-pay | Admitting: Family Medicine

## 2024-06-30 DIAGNOSIS — E291 Testicular hypofunction: Secondary | ICD-10-CM | POA: Insufficient documentation

## 2024-08-11 ENCOUNTER — Ambulatory Visit: Admitting: Family Medicine

## 2024-08-11 ENCOUNTER — Other Ambulatory Visit: Payer: Self-pay

## 2024-08-11 ENCOUNTER — Encounter: Payer: Self-pay | Admitting: Family Medicine

## 2024-08-11 VITALS — BP 120/84 | HR 80 | Temp 98.1°F | Ht 69.0 in | Wt 268.4 lb

## 2024-08-11 DIAGNOSIS — K601 Chronic anal fissure: Secondary | ICD-10-CM | POA: Diagnosis not present

## 2024-08-11 DIAGNOSIS — K580 Irritable bowel syndrome with diarrhea: Secondary | ICD-10-CM

## 2024-08-11 DIAGNOSIS — K591 Functional diarrhea: Secondary | ICD-10-CM

## 2024-08-11 DIAGNOSIS — Z1509 Genetic susceptibility to other malignant neoplasm: Secondary | ICD-10-CM

## 2024-08-11 DIAGNOSIS — K602 Anal fissure, unspecified: Secondary | ICD-10-CM

## 2024-08-11 DIAGNOSIS — Z8719 Personal history of other diseases of the digestive system: Secondary | ICD-10-CM

## 2024-08-11 MED ORDER — NA SULFATE-K SULFATE-MG SULF 17.5-3.13-1.6 GM/177ML PO SOLN
1.0000 | Freq: Once | ORAL | 0 refills | Status: AC
  Filled 2024-08-11 – 2024-08-31 (×2): qty 354, 1d supply, fill #0

## 2024-08-11 NOTE — Progress Notes (Signed)
 "   08/11/2024 Charles Gonzales [id number] [birth year]  Gastroenterology Office Note    Referring Provider: Cleatus Arlyss RAMAN, MD Primary Care Physician:  Cleatus Arlyss RAMAN, MD  Primary GI Provider: Celestia Rima, NP; Jinny Carmine, MD    Chief Complaint   Chief Complaint  Patient presents with   New Patient (Initial Visit)    Positive lynch syndrome- colon screen- HX IBS -constipation and diarrhea- colonoscopy 2003 -normal- no longer can get records- hx anal fissure-notices bleeding with BM-stool softeners for constipation-     History of Present Illness   Charles Gonzales is a 44 y.o. male presenting today at the request of Cleatus Arlyss RAMAN, MD due to MSH6 gene mutation positive   Discussed the use of AI scribe software for clinical note transcription with the patient, who gave verbal consent to proceed.  Longstanding gastrointestinal symptoms consistent with irritable bowel syndrome include frequent episodes of diarrhea, typically triggered by greasy foods and milk. Diarrhea occurs within one to three hours of a trigger and requires one to three bowel movements in a short period. Dietary triggers are avoided, including regular milk, and A2 milk is tolerated in moderation. Bowel movements are daily and symptoms are currently well controlled, with only occasional episodes if trigger foods are consumed. Major stomach pain is denied except during acute episodes. Colace and dietary fiber have been used without significant benefit; Miralax has not been tried.  The patient reports that a doctor identified an anal fissure around 2017, and it has been intermittently symptomatic, with periods of improvement and exacerbation. Symptoms include pain, especially with hard bowel movements, and intermittent bright red rectal bleeding, typically after hard stools. Pain during hard bowel movements is described as severe, sometimes requiring bracing, but there is no constant pain or inability to sit.  Compounded diltiazem cream is used once daily. Hemorrhoid cream is avoided due to severe burning with prior use. No surgical evaluation has been performed. No severe, constant pain or inability to sit, and no continuous external bleeding recently. .  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 No prior colonoscopy has been performed. Sigmoidoscopy in 2002 or 2003 was unremarkable.   Patient had genetic testing that showed MSH6 gene mutation positive.  Patient was seen by PCP on 1120 07/2023 and discussed Lynch syndrome and referred for colonoscopy.   Past Medical History:  Diagnosis Date   Allergy Childhood   Carpal tunnel syndrome    Hypothyroid 12/21/2023   IBS (irritable bowel syndrome)    Lichen planus, oral    Peyronie disease    Rectal fissure     Past Surgical History:  Procedure Laterality Date   CARPAL TUNNEL RELEASE     CIRCUMCISION     repeat at age 68    Current Outpatient Medications  Medication Sig Dispense Refill   fexofenadine (ALLEGRA) 180 MG tablet Take 180 mg by mouth daily as needed for allergies or rhinitis.     levothyroxine  (SYNTHROID ) 25 MCG tablet Take 1 tablet (25 mcg total) by mouth daily. Take in the early morning on empty stomach. 90 tablet 3   Na Sulfate-K Sulfate-Mg Sulfate concentrate (SUPREP) 17.5-3.13-1.6 GM/177ML SOLN Use as directed. At 5 PM the day before your procedure pour the contents of one bottle of Suprep into the mixing container provided.  Fill the container, with ice cold water, up to the 16 oz fill line, and drink the entire amount. Then 5 hours before procedure repeat with second  bottle. 354 mL 0   NONFORMULARY OR COMPOUNDED ITEM Diltiazem cream 2%. Apply to affected area twice a day if needed.  30g 1 RF.  12/18/23     sildenafil  (REVATIO ) 20 MG tablet Take 1-5 tablets (20-100 mg total) by mouth daily as needed. 50 tablet 12   No current facility-administered medications for this visit.    Allergies as of  08/11/2024 - Review Complete 08/11/2024  Allergen Reaction Noted   Clindamycin/lincomycin Shortness Of Breath 05/21/2016   Banana Itching 12/18/2023   Walnut Itching 12/18/2023   Amoxicillin Rash 05/21/2016    Family History  Problem Relation Age of Onset   Diabetes Father    Hyperlipidemia Father    Hypertension Father    Heart disease Paternal Grandfather    Diabetes Paternal Grandfather    Kidney disease Paternal Grandfather    Colon cancer Neg Hx    Prostate cancer Neg Hx     Social History   Socioeconomic History   Marital status: Married    Spouse name: Not on file   Number of children: Not on file   Years of education: Not on file   Highest education level: Master's degree (e.g., MA, MS, MEng, MEd, MSW, MBA)  Occupational History   Not on file  Tobacco Use   Smoking status: Never   Smokeless tobacco: Never  Substance and Sexual Activity   Alcohol use: No   Drug use: No   Sexual activity: Yes    Birth control/protection: None  Other Topics Concern   Not on file  Social History Narrative   From [place]. Married 2001. 6 children, oldest born in 2004, youngest born in 2017.   [employer], PA school at [employer]   Physician assistant, previously did chronic pain work, now does wound care work   Social Drivers of Health   Tobacco Use: Low Risk (08/11/2024)   Patient History    Smoking Tobacco Use: Never    Smokeless Tobacco Use: Never    Passive Exposure: Not on file  Financial Resource Strain: Low Risk (06/17/2024)   Overall Financial Resource Strain (CARDIA)    Difficulty of Paying Living Expenses: Not hard at all  Food Insecurity: No Food Insecurity (06/17/2024)   Epic    Worried About Programme Researcher, Broadcasting/film/video in the Last Year: Never true    Ran Out of Food in the Last Year: Never true  Transportation Needs: No Transportation Needs (06/17/2024)   Epic    Lack of Transportation (Medical): No    Lack of Transportation (Non-Medical): No  Physical Activity:  Insufficiently Active (06/17/2024)   Exercise Vital Sign    Days of Exercise per Week: 5 days    Minutes of Exercise per Session: 20 min  Stress: No Stress Concern Present (06/17/2024)   [employer] of Occupational Health - Occupational Stress Questionnaire    Feeling of Stress: Not at all  Social Connections: Socially Integrated (06/17/2024)   Social Connection and Isolation Panel    Frequency of Communication with Friends and Family: More than three times a week    Frequency of Social Gatherings with Friends and Family: More than three times a week    Attends Religious Services: More than 4 times per year    Active Member of Clubs or Organizations: Yes    Attends Banker Meetings: More than 4 times per year    Marital Status: Married  Catering Manager Violence: Not on file  Depression (PHQ2-9): Low Risk (11/07/2022)   Depression (  PHQ2-9)    PHQ-2 Score: 0  Alcohol Screen: Not on file  Housing: Low Risk (06/17/2024)   Epic    Unable to Pay for Housing in the Last Year: No    Number of Times Moved in the Last Year: 0    Homeless in the Last Year: No  Utilities: Not on file  Health Literacy: Not on file     RELEVANT GI HISTORY, IMAGING AND LABS: CBC    Component Value Date/Time   WBC 16.5 (H) 06/16/2014 0458   RBC 4.98 06/16/2014 0458   HGB 15.0 10/05/2023 0000   HGB 14.0 06/16/2014 0458   HCT 42.3 06/16/2014 0458   PLT 264 10/05/2023 0000   PLT 172 06/16/2014 0458   MCV 85 06/16/2014 0458   MCH 28.2 06/16/2014 0458   MCHC 33.2 06/16/2014 0458   RDW 13.5 06/16/2014 0458   LYMPHSABS 0.1 (L) 06/16/2014 0458   MONOABS 0.8 06/16/2014 0458   EOSABS 0.2 06/16/2014 0458   BASOSABS 0.1 06/16/2014 0458   Recent Labs    10/05/23 0000  HGB 15.0    CMP     Component Value Date/Time   NA 138 11/14/2022 1409   NA 136 06/15/2014 0756   K 3.8 11/14/2022 1409   K 3.8 06/15/2014 0756   CL 103 11/14/2022 1409   CL 104 06/15/2014 0756   CO2 28 11/14/2022  1409   CO2 24 06/15/2014 0756   GLUCOSE 84 11/14/2022 1409   GLUCOSE 108 (H) 06/15/2014 0756   BUN 14 11/14/2022 1409   BUN 20 (H) 06/15/2014 0756   CREATININE 0.9 10/05/2023 0000   CREATININE 0.94 11/14/2022 1409   CREATININE 1.14 06/15/2014 0756   CALCIUM 9.2 11/14/2022 1409   CALCIUM 8.8 06/15/2014 0756   PROT 7.4 11/14/2022 1409   PROT 7.4 06/15/2014 0756   ALBUMIN 4.3 11/14/2022 1409   ALBUMIN 4.1 06/15/2014 0756   AST 15 10/05/2023 0000   AST 57 (H) 06/15/2014 0756   ALT 30 10/05/2023 0000   ALT 84 (H) 06/15/2014 0756   ALKPHOS 86 11/14/2022 1409   ALKPHOS 110 06/15/2014 0756   BILITOT 0.8 11/14/2022 1409   BILITOT 1.0 06/15/2014 0756   GFRNONAA >60 06/15/2014 0756   GFRAA >60 06/15/2014 0756      Latest Ref Rng & Units 10/05/2023   12:00 AM 11/14/2022    2:09 PM 06/15/2014    7:56 AM  Hepatic Function  Total Protein 6.0 - 8.3 g/dL  7.4  7.4   Albumin 3.5 - 5.2 g/dL  4.3  4.1   AST 14 - 40 15     18  57   ALT 10 - 40 U/L 30     30  84   Alk Phosphatase 39 - 117 U/L  86  110   Total Bilirubin 0.2 - 1.2 mg/dL  0.8  1.0      This result is from an external source.      Review of Systems   All systems reviewed and negative except where noted in HPI.    Physical Exam  BP 120/84   Pulse 80   Temp 98.1 F (36.7 C)   Ht 5' 9 (1.753 m)   Wt 268 lb 6.4 oz (121.7 kg)   SpO2 97%   BMI 39.64 kg/m  No LMP for male patient. General:   Alert and oriented. Pleasant and cooperative. Well-nourished and well-developed. In no acute distress.  Head:  Normocephalic and atraumatic. Eyes:  Without icterus Ears:  Normal auditory acuity. Abdomen:  Normal bowel sounds.  No bruits.  Soft, non-tender and non-distended without masses, hepatosplenomegaly or hernias noted.  No guarding or rebound tenderness.  Rectal:  Chaperone Dorothe, CMA. DRE reveals what feels like anal fissure in the anterior midline. Msk:  Symmetrical without gross deformities. Normal posture. Extremities:   Without edema. Neurologic:  Alert and  oriented x4;  grossly normal neurologically. Skin:  Intact without significant lesions or rashes. Psych:  Alert and cooperative. Normal mood and affect.   Assessment & Plan   Charles Gonzales is a 44 y.o. male presenting today to discuss MSH6 mutation, chronic anal fissure, and history of IBS.   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 Discussed Lynch syndrome cancers, no current malignancy symptoms. - Schedule EGD and colonoscopy in the near future. I discussed risks of EGD with patient today, including risk of sedation, bleeding or perforation. Patient provides understanding and gave verbal consent to proceed.  Anal fissure. Intermittent pain and bleeding.  - continue diltiazem compound but increase to twice daily. - discussed colorectal surgical consult if no improvement with BID application of diltiazem and worsening symptoms.   History of IBS-D. Patient feels that symptoms overall controlled. - will check celiac panel - Recommended Miralax and fiber supplementation for stool regulation, with titration instructions.  Grayce Bohr, DNP, AGNP-C [facility]  "

## 2024-08-12 ENCOUNTER — Other Ambulatory Visit
Admission: RE | Admit: 2024-08-12 | Discharge: 2024-08-12 | Disposition: A | Discharge status: Home / Self Care | Source: Ambulatory Visit | Attending: Family Medicine | Admitting: Family Medicine

## 2024-08-12 DIAGNOSIS — K591 Functional diarrhea: Secondary | ICD-10-CM | POA: Insufficient documentation

## 2024-08-14 LAB — CELIAC DISEASE PANEL
Endomysial Ab, IgA: NEGATIVE
IgA: 195 mg/dL (ref 90–386)
Tissue Transglutaminase Ab, IgA: <2 U/mL (ref 0–3)

## 2024-08-15 ENCOUNTER — Ambulatory Visit: Payer: Self-pay | Admitting: Family Medicine

## 2024-08-22 ENCOUNTER — Other Ambulatory Visit: Payer: Self-pay

## 2024-08-31 ENCOUNTER — Other Ambulatory Visit: Payer: Self-pay

## 2024-09-09 ENCOUNTER — Ambulatory Visit: Admit: 2024-09-09
